# Patient Record
Sex: Male | Born: 1995 | Race: Black or African American | Hispanic: No | Marital: Single | Smoking: Former smoker
Health system: Southern US, Community
[De-identification: ages and names within clinical notes are randomized; demographics above are authoritative.]

## PROBLEM LIST (undated history)

## (undated) DIAGNOSIS — E119 Type 2 diabetes mellitus without complications: Secondary | ICD-10-CM

## (undated) DIAGNOSIS — J45909 Unspecified asthma, uncomplicated: Secondary | ICD-10-CM

---
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
## 2013-03-06 ENCOUNTER — Encounter (HOSPITAL_COMMUNITY): Payer: Self-pay

## 2013-03-06 ENCOUNTER — Emergency Department (HOSPITAL_COMMUNITY)
Admission: EM | Admit: 2013-03-06 | Discharge: 2013-03-06 | Disposition: A | Discharge status: Home / Self Care | Payer: Medicaid Other | Attending: Emergency Medicine | Admitting: Emergency Medicine

## 2013-03-06 DIAGNOSIS — R55 Syncope and collapse: Secondary | ICD-10-CM | POA: Insufficient documentation

## 2013-03-06 DIAGNOSIS — J45909 Unspecified asthma, uncomplicated: Secondary | ICD-10-CM | POA: Insufficient documentation

## 2013-03-06 DIAGNOSIS — K089 Disorder of teeth and supporting structures, unspecified: Secondary | ICD-10-CM | POA: Insufficient documentation

## 2013-03-06 DIAGNOSIS — E119 Type 2 diabetes mellitus without complications: Secondary | ICD-10-CM | POA: Insufficient documentation

## 2013-03-06 DIAGNOSIS — Z79899 Other long term (current) drug therapy: Secondary | ICD-10-CM | POA: Insufficient documentation

## 2013-03-06 DIAGNOSIS — Z87891 Personal history of nicotine dependence: Secondary | ICD-10-CM | POA: Insufficient documentation

## 2013-03-06 DIAGNOSIS — R110 Nausea: Secondary | ICD-10-CM | POA: Insufficient documentation

## 2013-03-06 DIAGNOSIS — K047 Periapical abscess without sinus: Secondary | ICD-10-CM

## 2013-03-06 DIAGNOSIS — R51 Headache: Secondary | ICD-10-CM | POA: Insufficient documentation

## 2013-03-06 DIAGNOSIS — I10 Essential (primary) hypertension: Secondary | ICD-10-CM

## 2013-03-06 HISTORY — DX: Type 2 diabetes mellitus without complications: E11.9

## 2013-03-06 LAB — URINE MICROSCOPIC-ADD ON

## 2013-03-06 LAB — BASIC METABOLIC PANEL
BUN: 13 mg/dL (ref 6–23)
Creatinine, Ser: 1.05 mg/dL (ref 0.50–1.35)
GFR calc Af Amer: >90 mL/min (ref >90)
GFR calc non Af Amer: >90 mL/min (ref >90)

## 2013-03-06 LAB — CBC WITH DIFFERENTIAL/PLATELET
Basophils Relative: 0 % (ref 0–1)
Eosinophils Absolute: 0 10*3/uL (ref 0.0–0.7)
HCT: 39.5 % (ref 39.0–52.0)
Hemoglobin: 13.9 g/dL (ref 13.0–17.0)
MCH: 31.4 pg (ref 26.0–34.0)
MCHC: 35.2 g/dL (ref 30.0–36.0)
MCV: 89.2 fL (ref 78.0–100.0)
Monocytes Absolute: 0.6 10*3/uL (ref 0.1–1.0)
Monocytes Relative: 6 % (ref 3–12)

## 2013-03-06 LAB — URINALYSIS, ROUTINE W REFLEX MICROSCOPIC
Bilirubin Urine: NEGATIVE
Ketones, ur: NEGATIVE mg/dL
Nitrite: NEGATIVE
Urobilinogen, UA: 1 mg/dL (ref 0.0–1.0)
pH: 6 (ref 5.0–8.0)

## 2013-03-06 LAB — CK TOTAL AND CKMB (NOT AT ARMC): Relative Index: 0.8 (ref 0.0–2.5)

## 2013-03-06 MED ORDER — TRAMADOL HCL 50 MG PO TABS: 50.0000 mg | ORAL_TABLET | Freq: Four times a day (QID) | ORAL | Status: DC | PRN

## 2013-03-06 MED ORDER — TRAMADOL HCL 50 MG PO TABS
50.0000 mg | ORAL_TABLET | Freq: Four times a day (QID) | ORAL | Status: DC
  Administered 2013-03-06 (×2): 50 mg via ORAL
  Filled 2013-03-06 (×2): qty 1

## 2013-03-06 MED ORDER — IBUPROFEN 800 MG PO TABS: 800.0000 mg | ORAL_TABLET | Freq: Four times a day (QID) | ORAL | Status: DC | PRN

## 2013-03-06 MED ORDER — IBUPROFEN 800 MG PO TABS
800.0000 mg | ORAL_TABLET | Freq: Once | ORAL | Status: AC
  Administered 2013-03-06: 800 mg via ORAL
  Filled 2013-03-06: qty 1

## 2013-03-06 NOTE — ED Provider Notes (Signed)
History     CSN: [id number]  Arrival date & time 03/06/13  0007   First MD Initiated Contact with Patient 03/06/13 0028      Chief Complaint  Patient presents with  . Fatigue    (Consider location/radiation/quality/duration/timing/severity/associated sxs/prior treatment) HPI 17 yo male presents to the ER from home via EMS with complaint of generalized weakness, headache, tooth pain.  Pt was seen earlier today by his dentist, dx with abscessed tooth and started on clindamycin.  Took 400 mg of advil at around 4 pm. Dinner around 5 pm. Had [employer] from [employer], practiced a little more than usual.  It is his norm to be tired after practice.  Pt reports he felt dizzy, nauseated, hot, and passed out.  Pt reports h/o diabetes, diet controlled.  Asthma-exercise induced.  He reports his mother thinks he has high blood pressure.  No fevers, chills.  Did not strike his head when he passed out.  Past Medical History  Diagnosis Date  . Asthma   . Diabetes mellitus without complication     History reviewed. No pertinent past surgical history.  History reviewed. No pertinent family history.  History  Substance Use Topics  . Smoking status: Former Smoker -- 0.01 packs/day for .2 years    Quit date: 03/17/2012  . Smokeless tobacco: Never Used  . Alcohol Use: No      Review of Systems  All other systems reviewed and are negative.    Allergies  Coconut fatty acids and Mushroom extract complex  Home Medications   Current Outpatient Rx  Name  Route  Sig  Dispense  Refill  . ibuprofen (ADVIL,MOTRIN) 200 MG tablet   Oral   Take 400-800 mg by mouth every 6 (six) hours as needed for pain.         Marland Kitchen PRESCRIPTION MEDICATION   Oral   Take 1 tablet by mouth 4 (four) times daily. Antibiotic for tooth infection  Patient has had one day of this medication.         Marland Kitchen albuterol (PROVENTIL HFA;VENTOLIN HFA) 108 (90 BASE) MCG/ACT inhaler   Inhalation   Inhale 2 puffs into the lungs  every 6 (six) hours as needed. Before exercise           BP 141/96  Pulse 71  Temp(Src) 98.3 F (36.8 C)  Resp 19  SpO2 99%  Physical Exam  Nursing note and vitals reviewed. Constitutional: He is oriented to person, place, and time. He appears well-developed and well-nourished.  Fatigued appearing  HENT:  Head: Normocephalic and atraumatic.  Right Ear: External ear normal.  Left Ear: External ear normal.  Nose: Nose normal.  Mouth/Throat: Oropharynx is clear and moist.  Dental decay noted to second molar on right lower jaw, no fluutance noted.  No LAD  Eyes: Conjunctivae and EOM are normal. Pupils are equal, round, and reactive to light.  Neck: Normal range of motion. Neck supple. No JVD present. No tracheal deviation present. No thyromegaly present.  Cardiovascular: Normal rate, regular rhythm, normal heart sounds and intact distal pulses.  Exam reveals no gallop and no friction rub.   No murmur heard. Pulmonary/Chest: Effort normal and breath sounds normal. No stridor. No respiratory distress. He has no wheezes. He has no rales. He exhibits no tenderness.  Abdominal: Soft. Bowel sounds are normal. He exhibits no distension and no mass. There is no tenderness. There is no rebound and no guarding.  Musculoskeletal: Normal range of motion. He exhibits no edema  and no tenderness.  Lymphadenopathy:    He has no cervical adenopathy.  Neurological: He is alert and oriented to person, place, and time. He has normal reflexes. No cranial nerve deficit. He exhibits normal muscle tone. Coordination normal.  Skin: Skin is warm and dry. No rash noted. No erythema. No pallor.  Psychiatric: He has a normal mood and affect. His behavior is normal. Judgment and thought content normal.    ED Course  Procedures (including critical care time)  Labs Reviewed  CBC WITH DIFFERENTIAL - Abnormal; Notable for the following:    WBC 10.9 (*)    RDW 11.3 (*)    Neutro Abs 8.4 (*)    All other  components within normal limits  URINALYSIS, ROUTINE W REFLEX MICROSCOPIC - Abnormal; Notable for the following:    Leukocytes, UA SMALL (*)    All other components within normal limits  CK TOTAL AND CKMB - Abnormal; Notable for the following:    Total CK 307 (*)    All other components within normal limits  URINE CULTURE  BASIC METABOLIC PANEL  URINE MICROSCOPIC-ADD ON   No results found.   Date: 03/06/2013  Rate: 71  Rhythm: normal sinus rhythm  QRS Axis: normal  Intervals: normal  ST/T Wave abnormalities: early repolarization  Conduction Disutrbances:none  Narrative Interpretation:   Old EKG Reviewed: none available    1. Syncope   2. Hypertension   3. Dental abscess       MDM  17 yo male with weakness, syncope.  Will check labs, orthostatics.  EKG with early repol.    Workup unremarkable aside from HTN.  Pt feeling better after pain medications.  To f/u with pcm for htn evaluation.        Olivia Mackie, MD 03/06/13 [phone]

## 2013-03-06 NOTE — ED Notes (Signed)
Per EMS: Pt from home with c/o generalized weakness since 2300. Pt seen today by dentist with abscess and sent home with rx Clindamycin, tylenol. Hx: diabetic. BG 86. EKG shows spiked T waves. PT AO x 4. 74 SR.

## 2013-03-07 LAB — URINE CULTURE: Colony Count: NO GROWTH

## 2013-08-23 ENCOUNTER — Emergency Department (HOSPITAL_COMMUNITY)
Admission: EM | Admit: 2013-08-23 | Discharge: 2013-08-24 | Disposition: A | Discharge status: Home / Self Care | Payer: No Typology Code available for payment source | Attending: Emergency Medicine | Admitting: Emergency Medicine

## 2013-08-23 ENCOUNTER — Encounter (HOSPITAL_COMMUNITY): Payer: Self-pay | Admitting: *Deleted

## 2013-08-23 DIAGNOSIS — E119 Type 2 diabetes mellitus without complications: Secondary | ICD-10-CM | POA: Insufficient documentation

## 2013-08-23 DIAGNOSIS — J45909 Unspecified asthma, uncomplicated: Secondary | ICD-10-CM | POA: Insufficient documentation

## 2013-08-23 DIAGNOSIS — Z87891 Personal history of nicotine dependence: Secondary | ICD-10-CM | POA: Insufficient documentation

## 2013-08-23 DIAGNOSIS — R509 Fever, unspecified: Secondary | ICD-10-CM | POA: Insufficient documentation

## 2013-08-23 DIAGNOSIS — J029 Acute pharyngitis, unspecified: Secondary | ICD-10-CM | POA: Insufficient documentation

## 2013-08-23 MED ORDER — ACETAMINOPHEN 325 MG PO TABS
650.0000 mg | ORAL_TABLET | Freq: Once | ORAL | Status: AC
  Administered 2013-08-23: 650 mg via ORAL
  Filled 2013-08-23: qty 2

## 2013-08-23 NOTE — ED Provider Notes (Signed)
CSN: [id number]     Arrival date & time 08/23/13  2137 History   First MD Initiated Contact with Patient 08/23/13 2331     Chief Complaint  Patient presents with  . Sore Throat   (Consider location/radiation/quality/duration/timing/severity/associated sxs/prior Treatment) HPI Comments: Patient states she's had a sore throat for the last 24 hours.  He tried over-the-counter throat lozenges with minimal relief.  He denies any fever, although he has a fever.  In the emergency Department 102.3 is a non-insulin-dependent diabetic, with normal blood sugars  Patient is a 17 y.o. male presenting with pharyngitis. The history is provided by the patient.  Sore Throat This is a new problem. The current episode started yesterday. The problem occurs constantly. The problem has been unchanged. Pertinent negatives include no fever or myalgias. The symptoms are aggravated by swallowing. Treatments tried: Throat lozenges. The treatment provided mild relief.    Past Medical History  Diagnosis Date  . Asthma   . Diabetes mellitus without complication    History reviewed. No pertinent past surgical history. No family history on file. History  Substance Use Topics  . Smoking status: Former Smoker -- 0.01 packs/day for .2 years    Quit date: 03/17/2012  . Smokeless tobacco: Never Used  . Alcohol Use: No    Review of Systems  Constitutional: Negative for fever.  HENT: Negative for rhinorrhea.   Musculoskeletal: Negative for myalgias.  All other systems reviewed and are negative.    Allergies  Coconut fatty acids and Mushroom extract complex  Home Medications   Current Outpatient Rx  Name  Route  Sig  Dispense  Refill  . Throat Lozenges (COUGH DROPS MENTHOL MT)   Mouth/Throat   Use as directed 1 lozenge in the mouth or throat daily as needed (for sore throat).          BP 132/74  Pulse 92  Temp(Src) 102.3 F (39.1 C) (Oral)  Resp 18  SpO2 98% Physical Exam  Nursing note and vitals  reviewed. Constitutional: He appears well-developed and well-nourished.  HENT:  Head: Normocephalic.  Mouth/Throat: Oropharynx is clear and moist.  Eyes: Pupils are equal, round, and reactive to light.  Cardiovascular: Normal rate and regular rhythm.   Pulmonary/Chest: Effort normal and breath sounds normal.  Lymphadenopathy:    He has no cervical adenopathy.  Neurological: He is alert.    ED Course  Procedures (including critical care time) Labs Review Labs Reviewed  RAPID STREP SCREEN  CULTURE, GROUP A STREP   Imaging Review No results found.  MDM  No diagnosis found.  Strep test is negative.  Throat.  Does not look infected.  Patient is instructed to take alternating doses of Tylenol, and ibuprofen, drink plenty of fluids and get enough rest    Arman Filter, NP 08/23/13 2341

## 2013-08-23 NOTE — ED Notes (Signed)
The pt has had a sore throat since last pm with bi-lateral earaches

## 2013-08-24 MED ORDER — IBUPROFEN 200 MG PO TABS
600.0000 mg | ORAL_TABLET | Freq: Once | ORAL | Status: AC
  Administered 2013-08-24: 600 mg via ORAL
  Filled 2013-08-24 (×2): qty 1

## 2013-08-24 NOTE — ED Provider Notes (Signed)
Medical screening examination/treatment/procedure(s) were performed by non-physician practitioner and as supervising physician I was immediately available for consultation/collaboration.   Brandt Loosen, MD 08/24/13 2257

## 2013-08-24 NOTE — ED Notes (Signed)
Pt denies any questions upon discharge, pt medicated for fever prior to discharge.

## 2013-08-25 ENCOUNTER — Emergency Department (HOSPITAL_COMMUNITY)
Admission: EM | Admit: 2013-08-25 | Discharge: 2013-08-26 | Disposition: A | Discharge status: Home / Self Care | Payer: No Typology Code available for payment source | Attending: Emergency Medicine | Admitting: Emergency Medicine

## 2013-08-25 ENCOUNTER — Encounter (HOSPITAL_COMMUNITY): Payer: Self-pay | Admitting: Emergency Medicine

## 2013-08-25 DIAGNOSIS — R509 Fever, unspecified: Secondary | ICD-10-CM | POA: Insufficient documentation

## 2013-08-25 DIAGNOSIS — R1013 Epigastric pain: Secondary | ICD-10-CM | POA: Insufficient documentation

## 2013-08-25 DIAGNOSIS — R1012 Left upper quadrant pain: Secondary | ICD-10-CM | POA: Insufficient documentation

## 2013-08-25 DIAGNOSIS — Z87891 Personal history of nicotine dependence: Secondary | ICD-10-CM | POA: Insufficient documentation

## 2013-08-25 DIAGNOSIS — K5900 Constipation, unspecified: Secondary | ICD-10-CM | POA: Insufficient documentation

## 2013-08-25 DIAGNOSIS — J029 Acute pharyngitis, unspecified: Secondary | ICD-10-CM | POA: Insufficient documentation

## 2013-08-25 DIAGNOSIS — J45909 Unspecified asthma, uncomplicated: Secondary | ICD-10-CM | POA: Insufficient documentation

## 2013-08-25 DIAGNOSIS — IMO0001 Reserved for inherently not codable concepts without codable children: Secondary | ICD-10-CM | POA: Insufficient documentation

## 2013-08-25 DIAGNOSIS — R000 Tachycardia, unspecified: Secondary | ICD-10-CM | POA: Insufficient documentation

## 2013-08-25 DIAGNOSIS — E119 Type 2 diabetes mellitus without complications: Secondary | ICD-10-CM | POA: Insufficient documentation

## 2013-08-25 DIAGNOSIS — R1011 Right upper quadrant pain: Secondary | ICD-10-CM | POA: Insufficient documentation

## 2013-08-25 LAB — MONONUCLEOSIS SCREEN: Mono Screen: NEGATIVE

## 2013-08-25 MED ORDER — SODIUM CHLORIDE 0.9 % IV BOLUS (SEPSIS)
1000.0000 mL | Freq: Once | INTRAVENOUS | Status: AC
  Administered 2013-08-25: 1000 mL via INTRAVENOUS

## 2013-08-25 MED ORDER — KETOROLAC TROMETHAMINE 30 MG/ML IJ SOLN
30.0000 mg | Freq: Once | INTRAMUSCULAR | Status: AC
  Administered 2013-08-25: 30 mg via INTRAVENOUS
  Filled 2013-08-25: qty 1

## 2013-08-25 NOTE — ED Provider Notes (Signed)
CSN: [id number]     Arrival date & time 08/25/13  2247 History   First MD Initiated Contact with Patient 08/25/13 2257     Chief Complaint  Patient presents with  . Sore Throat   (Consider location/radiation/quality/duration/timing/severity/associated sxs/prior Treatment) HPI Comments: She was seen 2 days ago, with the same complaints of pharyngitis, and low-grade fever.  His strep was negative at that time to check the culture is negative, as well.  He comes in reporting, that he's been taking Tylenol 500 mg every 4 hours and 400 mg of ibuprofen every 4 hours.  He continues to have sore throat, generalized myalgias, and now is complaining of abdominal pain, crampy in nature, and shaking.  He, says his appetite has been fine, and he is drinking lots of fluids, but his throat pain is still significant  Patient is a 17 y.o. male presenting with pharyngitis. The history is provided by the patient.  Sore Throat This is a recurrent problem. The current episode started in the past 7 days. The problem occurs constantly. The problem has been unchanged. Associated symptoms include chills, a fever, myalgias and a sore throat. Pertinent negatives include no abdominal pain, anorexia, coughing, nausea, neck pain, rash, swollen glands, urinary symptoms or vomiting. The symptoms are aggravated by swallowing. He has tried acetaminophen, NSAIDs and drinking for the symptoms. The treatment provided no relief.    Past Medical History  Diagnosis Date  . Asthma   . Diabetes mellitus without complication    History reviewed. No pertinent past surgical history. No family history on file. History  Substance Use Topics  . Smoking status: Former Smoker -- 0.01 packs/day for .2 years    Quit date: 03/17/2012  . Smokeless tobacco: Never Used  . Alcohol Use: No    Review of Systems  Constitutional: Positive for fever and chills.  HENT: Positive for sore throat. Negative for trouble swallowing and neck pain.    Respiratory: Negative for cough and shortness of breath.   Gastrointestinal: Positive for constipation. Negative for nausea, vomiting, abdominal pain and anorexia.  Genitourinary: Negative for dysuria.  Musculoskeletal: Positive for myalgias.  Skin: Negative for rash.  All other systems reviewed and are negative.    Allergies  Coconut fatty acids and Mushroom extract complex  Home Medications   Current Outpatient Rx  Name  Route  Sig  Dispense  Refill  . Throat Lozenges (COUGH DROPS MENTHOL MT)   Mouth/Throat   Use as directed 1 lozenge in the mouth or throat daily as needed (for sore throat).          BP 125/68  Pulse 120  Temp(Src) 100.9 F (38.3 C) (Oral)  Resp 22  SpO2 98% Physical Exam  Nursing note and vitals reviewed. Constitutional: He is oriented to person, place, and time. He appears well-developed and well-nourished.  HENT:  Head: Normocephalic and atraumatic.  Right Ear: External ear normal.  Left Ear: External ear normal.  Mouth/Throat: Mucous membranes are dry. No edematous. No oropharyngeal exudate or tonsillar abscesses.  Cardiovascular: Regular rhythm.  Tachycardia present.   Pulmonary/Chest: Effort normal and breath sounds normal. He has no wheezes.  Abdominal: Soft. He exhibits no distension. There is no hepatosplenomegaly, splenomegaly or hepatomegaly. There is tenderness in the right upper quadrant, epigastric area and left upper quadrant. There is no rigidity, no rebound and no guarding.  Lymphadenopathy:    He has no cervical adenopathy.  Neurological: He is alert and oriented to person, place, and time.  Skin:  Skin is warm and dry. No rash noted. No pallor.    ED Course  Procedures (including critical care time) Labs Review Labs Reviewed  MONONUCLEOSIS SCREEN  ACETAMINOPHEN LEVEL   Imaging Review No results found.  MDM  No diagnosis found. To 2 L of fluid, alternating doses of IV, Toradol, and Tylenol.  Patient is feeling, better,  heart rate is normalized.  Will be discharged home with continued azithromycin by mouth    Arman Filter, NP 08/26/13 0320

## 2013-08-25 NOTE — ED Notes (Signed)
Pt. reports persistent sore throat for several days seen here 2 days ago received ibuprofen discharged home with no improvement , respirations unlabored / airway intact.

## 2013-08-26 LAB — CULTURE, GROUP A STREP

## 2013-08-26 MED ORDER — ACETAMINOPHEN 325 MG PO TABS
650.0000 mg | ORAL_TABLET | Freq: Once | ORAL | Status: AC
  Administered 2013-08-26: 650 mg via ORAL
  Filled 2013-08-26: qty 2

## 2013-08-26 MED ORDER — SODIUM CHLORIDE 0.9 % IV BOLUS (SEPSIS)
1000.0000 mL | Freq: Once | INTRAVENOUS | Status: AC
  Administered 2013-08-26: 1000 mL via INTRAVENOUS

## 2013-08-26 MED ORDER — AZITHROMYCIN 250 MG PO TABS
500.0000 mg | ORAL_TABLET | Freq: Once | ORAL | Status: AC
  Administered 2013-08-26: 500 mg via ORAL
  Filled 2013-08-26: qty 2

## 2013-08-26 MED ORDER — ACETAMINOPHEN 325 MG PO TABS: 650.0000 mg | ORAL_TABLET | Freq: Once | ORAL | Status: DC

## 2013-08-26 MED ORDER — AZITHROMYCIN 250 MG PO TABS: 250.0000 mg | ORAL_TABLET | Freq: Every day | ORAL | Status: DC

## 2013-08-26 NOTE — ED Provider Notes (Signed)
Medical screening examination/treatment/procedure(s) were performed by non-physician practitioner and as supervising physician I was immediately available for consultation/collaboration.  Olivia Mackie, MD 08/26/13 [phone]

## 2013-12-30 ENCOUNTER — Encounter (HOSPITAL_COMMUNITY): Payer: Self-pay | Admitting: Emergency Medicine

## 2013-12-30 ENCOUNTER — Emergency Department (HOSPITAL_COMMUNITY)
Admission: EM | Admit: 2013-12-30 | Discharge: 2013-12-30 | Disposition: A | Discharge status: Home / Self Care | Payer: No Typology Code available for payment source | Attending: Emergency Medicine | Admitting: Emergency Medicine

## 2013-12-30 DIAGNOSIS — Z87891 Personal history of nicotine dependence: Secondary | ICD-10-CM | POA: Insufficient documentation

## 2013-12-30 DIAGNOSIS — M79609 Pain in unspecified limb: Secondary | ICD-10-CM | POA: Insufficient documentation

## 2013-12-30 DIAGNOSIS — K2900 Acute gastritis without bleeding: Secondary | ICD-10-CM | POA: Insufficient documentation

## 2013-12-30 DIAGNOSIS — E119 Type 2 diabetes mellitus without complications: Secondary | ICD-10-CM | POA: Insufficient documentation

## 2013-12-30 DIAGNOSIS — R110 Nausea: Secondary | ICD-10-CM | POA: Insufficient documentation

## 2013-12-30 DIAGNOSIS — J45909 Unspecified asthma, uncomplicated: Secondary | ICD-10-CM | POA: Insufficient documentation

## 2013-12-30 DIAGNOSIS — M549 Dorsalgia, unspecified: Secondary | ICD-10-CM | POA: Insufficient documentation

## 2013-12-30 DIAGNOSIS — K2970 Gastritis, unspecified, without bleeding: Secondary | ICD-10-CM

## 2013-12-30 LAB — CBC WITH DIFFERENTIAL/PLATELET
BASOS ABS: 0 10*3/uL (ref 0.0–0.1)
Basophils Relative: 0 % (ref 0–1)
EOS PCT: 1 % (ref 0–5)
Eosinophils Absolute: 0.1 10*3/uL (ref 0.0–0.7)
HEMATOCRIT: 41.6 % (ref 39.0–52.0)
Hemoglobin: 14.5 g/dL (ref 13.0–17.0)
LYMPHS ABS: 1.6 10*3/uL (ref 0.7–4.0)
LYMPHS PCT: 31 % (ref 12–46)
MCH: 30.8 pg (ref 26.0–34.0)
MCHC: 34.9 g/dL (ref 30.0–36.0)
MCV: 88.3 fL (ref 78.0–100.0)
Monocytes Absolute: 0.4 10*3/uL (ref 0.1–1.0)
Monocytes Relative: 8 % (ref 3–12)
NEUTROS ABS: 3.1 10*3/uL (ref 1.7–7.7)
Neutrophils Relative %: 60 % (ref 43–77)
PLATELETS: 220 10*3/uL (ref 150–400)
RBC: 4.71 MIL/uL (ref 4.22–5.81)
RDW: 11.3 % — AB (ref 11.5–15.5)
WBC: 5.1 10*3/uL (ref 4.0–10.5)

## 2013-12-30 LAB — COMPREHENSIVE METABOLIC PANEL
ALK PHOS: 55 U/L (ref 39–117)
ALT: 10 U/L (ref 0–53)
AST: 21 U/L (ref 0–37)
Albumin: 4.4 g/dL (ref 3.5–5.2)
BILIRUBIN TOTAL: 2 mg/dL — AB (ref 0.3–1.2)
BUN: 11 mg/dL (ref 6–23)
CHLORIDE: 103 meq/L (ref 96–112)
CO2: 29 meq/L (ref 19–32)
Calcium: 9.3 mg/dL (ref 8.4–10.5)
Creatinine, Ser: 0.97 mg/dL (ref 0.50–1.35)
GLUCOSE: 91 mg/dL (ref 70–99)
Potassium: 4.4 mEq/L (ref 3.7–5.3)
SODIUM: 141 meq/L (ref 137–147)
Total Protein: 7.7 g/dL (ref 6.0–8.3)

## 2013-12-30 LAB — LIPASE, BLOOD: Lipase: 40 U/L (ref 11–59)

## 2013-12-30 MED ORDER — ACETAMINOPHEN 325 MG PO TABS
650.0000 mg | ORAL_TABLET | Freq: Once | ORAL | Status: AC
  Administered 2013-12-30: 650 mg via ORAL
  Filled 2013-12-30: qty 2

## 2013-12-30 MED ORDER — ALUM & MAG HYDROXIDE-SIMETH 200-200-20 MG/5ML PO SUSP
30.0000 mL | Freq: Once | ORAL | Status: AC
  Administered 2013-12-30: 30 mL via ORAL
  Filled 2013-12-30: qty 30

## 2013-12-30 NOTE — Discharge Instructions (Signed)
°  Avoid using ibuprofen. Use Tylenol, for pain. Use Maalox before meals and at bedtime to help with abdominal discomfort.    Gastritis, Adult Gastritis is soreness and puffiness (inflammation) of the lining of the stomach. If you do not get help, gastritis can cause bleeding and sores (ulcers) in the stomach. HOME CARE   Only take medicine as told by your doctor.  If you were given antibiotic medicines, take them as told. Finish the medicines even if you start to feel better.  Drink enough fluids to keep your pee (urine) clear or pale yellow.  Avoid foods and drinks that make your problems worse. Foods you may want to avoid include:  Caffeine or alcohol.  Chocolate.  Mint.  Garlic and onions.  Spicy foods.  Citrus fruits, including oranges, lemons, or limes.  Food containing tomatoes, including sauce, chili, salsa, and pizza.  Fried and fatty foods.  Eat small meals throughout the day instead of large meals. GET HELP RIGHT AWAY IF:   You have black or dark red poop (stools).  You throw up (vomit) blood. It may look like coffee grounds.  You cannot keep fluids down.  Your belly (abdominal) pain gets worse.  You have a fever.  You do not feel better after 1 week.  You have any other questions or concerns. MAKE SURE YOU:   Understand these instructions.  Will watch your condition.  Will get help right away if you are not doing well or get worse. Document Released: 05/21/2008 Document Revised: 02/25/2012 Document Reviewed: 01/16/2012 [facility] Patient Information 2014 [place], [place].

## 2013-12-30 NOTE — ED Notes (Signed)
Pt presents to department for evaluation of abdominal pain and nausea. Onset today after taking aleve. 6/10 pain at the time. Pt is alert and oriented x4. No acute signs of distress noted.

## 2013-12-30 NOTE — ED Provider Notes (Signed)
CSN: [id number]     Arrival date & time 12/30/13  1603 History   First MD Initiated Contact with Patient 12/30/13 1931     Chief Complaint  Patient presents with  . Abdominal Pain  . Nausea   (Consider location/radiation/quality/duration/timing/severity/associated sxs/prior Treatment) Patient is a 18 y.o. male presenting with abdominal pain. The history is provided by the patient.  Abdominal Pain Cody Navarro is a 18 y.o. male who presents for evaluation of upper abdominal pain, that started after he took Advil, for arm pain and back pain. He has not had fever, chills, cough, shortness of breath, chest pain, weakness, or dizziness. He attributes the pain to recently starting back on the "drum-line". He had been off, on vacation, for 3 weeks. He has previously taken Advil without problems. There are no other known modifying factors.   Past Medical History  Diagnosis Date  . Asthma   . Diabetes mellitus without complication    History reviewed. No pertinent past surgical history. History reviewed. No pertinent family history. History  Substance Use Topics  . Smoking status: Former Smoker -- 0.01 packs/day for .2 years    Quit date: 03/17/2012  . Smokeless tobacco: Never Used  . Alcohol Use: No    Review of Systems  Gastrointestinal: Positive for abdominal pain.  All other systems reviewed and are negative.    Allergies  Banana; Coconut fatty acids; and Mushroom extract complex  Home Medications   Current Outpatient Rx  Name  Route  Sig  Dispense  Refill  . ibuprofen (ADVIL,MOTRIN) 200 MG tablet   Oral   Take 400 mg by mouth every 6 (six) hours as needed for moderate pain.          BP 128/88  Pulse 68  Temp(Src) 97.6 F (36.4 C) (Oral)  Resp 18  Ht 5\' 10"  (1.778 m)  Wt 156 lb (70.761 kg)  BMI 22.38 kg/m2  SpO2 99% Physical Exam  Nursing note and vitals reviewed. Constitutional: He is oriented to person, place, and time. He appears well-developed and  well-nourished.  HENT:  Head: Normocephalic and atraumatic.  Right Ear: External ear normal.  Left Ear: External ear normal.  Eyes: Conjunctivae and EOM are normal. Pupils are equal, round, and reactive to light.  Neck: Normal range of motion and phonation normal. Neck supple.  Cardiovascular: Normal rate, regular rhythm, normal heart sounds and intact distal pulses.   Pulmonary/Chest: Effort normal and breath sounds normal. He exhibits no bony tenderness.  Abdominal: Soft. Normal appearance. There is tenderness (epigastric, and suprapubic, mild).  Musculoskeletal: Normal range of motion.  Neurological: He is alert and oriented to person, place, and time. No cranial nerve deficit or sensory deficit. He exhibits normal muscle tone. Coordination normal.  Skin: Skin is warm, dry and intact.  Psychiatric: He has a normal mood and affect. His behavior is normal. Judgment and thought content normal.    ED Course  Procedures (including critical care time)  Medications  alum & mag hydroxide-simeth (MAALOX/MYLANTA) 200-200-20 MG/5ML suspension 30 mL (30 mLs Oral Given 12/30/13 1946)  acetaminophen (TYLENOL) tablet 650 mg (650 mg Oral Given 12/30/13 1946)    Patient Vitals for the past 24 hrs:  BP Temp Temp src Pulse Resp SpO2 Height Weight  12/30/13 2250 128/88 mmHg 97.6 F (36.4 C) Oral 68 18 99 % - -  12/30/13 2100 126/85 mmHg - - 78 - 100 % - -  12/30/13 2030 - - - 65 - 100 % - -  12/30/13 1945 - - - 66 - 100 % - -  12/30/13 1930 - - - 67 - 100 % - -  12/30/13 1923 117/82 mmHg 98 F (36.7 C) Oral 62 16 100 % - -  12/30/13 1619 119/80 mmHg 97.4 F (36.3 C) Oral 71 18 99 % 5\' 10"  (1.778 m) 156 lb (70.761 kg)    10:15 PM Reevaluation with update and discussion. After initial assessment and treatment, an updated evaluation reveals is comfortable, now, and denies abdominal pain. Cody Navarro      Labs Review Labs Reviewed  CBC WITH DIFFERENTIAL - Abnormal; Notable for the  following:    RDW 11.3 (*)    All other components within normal limits  COMPREHENSIVE METABOLIC PANEL - Abnormal; Notable for the following:    Total Bilirubin 2.0 (*)    All other components within normal limits  LIPASE, BLOOD   Imaging Review No results found.  EKG Interpretation   None       MDM   1. Gastritis      Nonspecific abdominal pain, resolved with antacid. I suspect mild gastritis related to use of ibuprofen  Nursing Notes Reviewed/ Care Coordinated Applicable Imaging Reviewed Interpretation of Laboratory Data incorporated into ED treatment  The patient appears reasonably screened and/or stabilized for discharge and I doubt any other medical condition or other [facility] requiring further screening, evaluation, or treatment in the ED at this time prior to discharge.  Plan: Home Medications- Maalox prn; Home Treatments- rest; return here if the recommended treatment, does not improve the symptoms; Recommended follow up- PCP of choice prn    Cody MelterElliott Navarro Ramsie Ostrander, MD 12/31/13 0021

## 2014-03-29 ENCOUNTER — Emergency Department (HOSPITAL_COMMUNITY): Payer: Medicaid Other

## 2014-03-29 ENCOUNTER — Emergency Department (HOSPITAL_COMMUNITY)
Admission: EM | Admit: 2014-03-29 | Discharge: 2014-03-29 | Disposition: A | Discharge status: Home / Self Care | Payer: Medicaid Other | Attending: Emergency Medicine | Admitting: Emergency Medicine

## 2014-03-29 ENCOUNTER — Encounter (HOSPITAL_COMMUNITY): Payer: Self-pay | Admitting: Emergency Medicine

## 2014-03-29 DIAGNOSIS — J45901 Unspecified asthma with (acute) exacerbation: Secondary | ICD-10-CM | POA: Insufficient documentation

## 2014-03-29 DIAGNOSIS — R071 Chest pain on breathing: Secondary | ICD-10-CM | POA: Insufficient documentation

## 2014-03-29 DIAGNOSIS — E119 Type 2 diabetes mellitus without complications: Secondary | ICD-10-CM | POA: Insufficient documentation

## 2014-03-29 DIAGNOSIS — Z87891 Personal history of nicotine dependence: Secondary | ICD-10-CM | POA: Insufficient documentation

## 2014-03-29 DIAGNOSIS — R0789 Other chest pain: Secondary | ICD-10-CM

## 2014-03-29 MED ORDER — IBUPROFEN 800 MG PO TABS
800.0000 mg | ORAL_TABLET | Freq: Once | ORAL | Status: AC
Start: 2014-03-29 — End: 2014-03-29
  Administered 2014-03-29: 800 mg via ORAL
  Filled 2014-03-29: qty 1

## 2014-03-29 MED ORDER — IBUPROFEN 600 MG PO TABS: 600.0000 mg | ORAL_TABLET | Freq: Four times a day (QID) | ORAL | Status: DC | PRN

## 2014-03-29 NOTE — ED Notes (Signed)
Pt states that he has been coughing x 1 week and has had chest wall pain that hurts worse when he breathes x 3 days. Alert and oriented.

## 2014-03-29 NOTE — ED Provider Notes (Signed)
CSN: [id number]     Arrival date & time 03/29/14  0019 History   First MD Initiated Contact with Patient 03/29/14 0058     Chief Complaint  Patient presents with  . Chest Wall Pain   . Cough     (Consider location/radiation/quality/duration/timing/severity/associated sxs/prior Treatment) HPI Comments: Patient is a 18 year old male with a history of asthma and DM who presents to the emergency department for right-sided chest pain x4 days. Patient states the pain is aching in nature and nonradiating. Pain is worse with deep breathing and coughing. Patient endorses an associated dry nonproductive cough. He has not taken anything for symptoms. He denies associated fever, syncope, hemoptysis, abdominal pain or vomiting, URI symptoms, a hx of coagulopathies, and recent surgeries or hospitalizations.  Patient is a 18 y.o. male presenting with cough. The history is provided by the patient. No language interpreter was used.  Cough Associated symptoms: chest pain and shortness of breath     Past Medical History  Diagnosis Date  . Asthma   . Diabetes mellitus without complication    History reviewed. No pertinent past surgical history. History reviewed. No pertinent family history. History  Substance Use Topics  . Smoking status: Former Smoker -- 0.01 packs/day for .2 years    Quit date: 03/17/2012  . Smokeless tobacco: Never Used  . Alcohol Use: No    Review of Systems  Respiratory: Positive for cough, chest tightness and shortness of breath.   Cardiovascular: Positive for chest pain.  All other systems reviewed and are negative.     Allergies  Banana; Coconut fatty acids; and Mushroom extract complex  Home Medications   Current Outpatient Rx  Name  Route  Sig  Dispense  Refill  . ibuprofen (ADVIL,MOTRIN) 600 MG tablet   Oral   Take 1 tablet (600 mg total) by mouth every 6 (six) hours as needed.   30 tablet   0    BP 112/70  Pulse 89  Temp(Src) 98.5 F (36.9 C) (Oral)   SpO2 98%  Physical Exam  Nursing note and vitals reviewed. Constitutional: He is oriented to person, place, and time. He appears well-developed and well-nourished. No distress.  HENT:  Head: Normocephalic and atraumatic.  Eyes: Conjunctivae and EOM are normal. No scleral icterus.  Neck: Normal range of motion.  Cardiovascular: Normal rate, regular rhythm, normal heart sounds and intact distal pulses.   Pulmonary/Chest: Effort normal and breath sounds normal. No respiratory distress. He has no wheezes. He has no rales.  No tachypnea, dyspnea, retractions, or accessory muscle use. Chest expansion symmetric. No chest tenderness to palpation.  Abdominal: Soft. He exhibits no distension. There is no tenderness.  Musculoskeletal: Normal range of motion.  Neurological: He is alert and oriented to person, place, and time.   GCS 15. Patient moves extremities without ataxia.  Skin: Skin is warm and dry. No rash noted. He is not diaphoretic. No erythema. No pallor.  Psychiatric: He has a normal mood and affect. His behavior is normal.    ED Course  Procedures (including critical care time) Labs Review Labs Reviewed - No data to display  Imaging Review Dg Chest 2 View  03/29/2014   CLINICAL DATA:  Upper mid chest pain  EXAM: CHEST  2 VIEW  COMPARISON:  None.  FINDINGS: The heart size and mediastinal contours are within normal limits. Both lungs are clear. Dextroscoliosis of the thoracolumbar spine.  IMPRESSION: No active cardiopulmonary disease.   Electronically Signed   By: Elige KoHetal  Patel  On: 03/29/2014 02:40     EKG Interpretation None      MDM   Final diagnoses:  Costochondral chest pain    Uncomplicated costochondral chest pain. Patient endorses frequent lifting at work and states he has been coughing times one week; symptoms likely secondary to one or both of these. Patient without tachypnea, dyspnea, or hypoxia today. He is afebrile and hemodynamically stable. Chest x-ray shows  no evidence of focal consolidation, pneumonia, or pneumothorax. No rib fracture is identified. Doubt pulmonary embolism; patient PERC negative. He is stable for discharge with prescription for ibuprofen for pain control. Return precautions provided and patient agreeable to plan with no unaddressed concerns.  Filed Vitals:   03/29/14 0025  BP: 112/70  Pulse: 89  Temp: 98.5 F (36.9 C)  TempSrc: Oral  SpO2: 98%       Antony MaduraKelly Maree Ainley, PA-C 03/29/14 [phone]

## 2014-03-29 NOTE — Discharge Instructions (Signed)

## 2014-04-04 NOTE — ED Provider Notes (Signed)
Medical screening examination/treatment/procedure(s) were performed by non-physician practitioner and as supervising physician I was immediately available for consultation/collaboration.   EKG Interpretation None        Steve Gregg M Mustafa Potts, MD 04/04/14 1408 

## 2014-05-22 ENCOUNTER — Emergency Department (HOSPITAL_COMMUNITY)
Admission: EM | Admit: 2014-05-22 | Discharge: 2014-05-22 | Disposition: A | Discharge status: Home / Self Care | Payer: Medicaid Other | Attending: Emergency Medicine | Admitting: Emergency Medicine

## 2014-05-22 ENCOUNTER — Encounter (HOSPITAL_COMMUNITY): Payer: Self-pay | Admitting: Emergency Medicine

## 2014-05-22 ENCOUNTER — Emergency Department (HOSPITAL_COMMUNITY): Payer: Medicaid Other

## 2014-05-22 DIAGNOSIS — E119 Type 2 diabetes mellitus without complications: Secondary | ICD-10-CM | POA: Insufficient documentation

## 2014-05-22 DIAGNOSIS — J45909 Unspecified asthma, uncomplicated: Secondary | ICD-10-CM | POA: Insufficient documentation

## 2014-05-22 DIAGNOSIS — Y939 Activity, unspecified: Secondary | ICD-10-CM | POA: Insufficient documentation

## 2014-05-22 DIAGNOSIS — IMO0002 Reserved for concepts with insufficient information to code with codable children: Secondary | ICD-10-CM | POA: Insufficient documentation

## 2014-05-22 DIAGNOSIS — Y929 Unspecified place or not applicable: Secondary | ICD-10-CM | POA: Insufficient documentation

## 2014-05-22 DIAGNOSIS — S60222A Contusion of left hand, initial encounter: Secondary | ICD-10-CM

## 2014-05-22 DIAGNOSIS — S60511A Abrasion of right hand, initial encounter: Secondary | ICD-10-CM

## 2014-05-22 DIAGNOSIS — Z87891 Personal history of nicotine dependence: Secondary | ICD-10-CM | POA: Insufficient documentation

## 2014-05-22 DIAGNOSIS — Z91018 Allergy to other foods: Secondary | ICD-10-CM | POA: Insufficient documentation

## 2014-05-22 DIAGNOSIS — S60512A Abrasion of left hand, initial encounter: Secondary | ICD-10-CM

## 2014-05-22 DIAGNOSIS — S60221A Contusion of right hand, initial encounter: Secondary | ICD-10-CM

## 2014-05-22 DIAGNOSIS — S60229A Contusion of unspecified hand, initial encounter: Secondary | ICD-10-CM | POA: Insufficient documentation

## 2014-05-22 MED ORDER — OXYCODONE-ACETAMINOPHEN 5-325 MG PO TABS
1.0000 | ORAL_TABLET | Freq: Once | ORAL | Status: AC
  Administered 2014-05-22: 1 via ORAL
  Filled 2014-05-22: qty 1

## 2014-05-22 MED ORDER — BACITRACIN ZINC 500 UNIT/GM EX OINT: 1.0000 "application " | TOPICAL_OINTMENT | Freq: Two times a day (BID) | CUTANEOUS | Status: DC

## 2014-05-22 MED ORDER — IBUPROFEN 600 MG PO TABS: 600.0000 mg | ORAL_TABLET | Freq: Four times a day (QID) | ORAL | Status: DC | PRN

## 2014-05-22 MED ORDER — IBUPROFEN 200 MG PO TABS
400.0000 mg | ORAL_TABLET | Freq: Once | ORAL | Status: AC
  Administered 2014-05-22: 400 mg via ORAL
  Filled 2014-05-22: qty 2

## 2014-05-22 NOTE — ED Provider Notes (Signed)
CSN: [id number]     Arrival date & time 05/22/14  0416 History   None    Chief Complaint  Patient presents with  . Hand Pain  . Hand Injury     (Consider location/radiation/quality/duration/timing/severity/associated sxs/prior Treatment) HPI This patient is a generally healthy young man who is right-hand dominant. He comes in with bilateral hand pain after having punched a wall. His pain is worse on the right. The pain is aching and diffuse. It is worse with movement of the fingers and hand. Nonradiating. Left hand pain is mild, worse with movement of the fingers. Patient denies trauma or pain in any other region.  His tetanus is up to date.  Past Medical History  Diagnosis Date  . Asthma   . Diabetes mellitus without complication    History reviewed. No pertinent past surgical history. No family history on file. History  Substance Use Topics  . Smoking status: Former Smoker -- 0.01 packs/day for .2 years    Quit date: 03/17/2012  . Smokeless tobacco: Never Used  . Alcohol Use: No    Review of Systems Ten point review of symptoms performed and is negative with the exception of symptoms noted above.     Allergies  Banana; Coconut fatty acids; and Mushroom extract complex  Home Medications   Prior to Admission medications   Not on File   BP 128/93  Pulse 88  Temp(Src) 97.9 F (36.6 C) (Oral)  Resp 30  Ht 5\' 10"  (1.778 m)  Wt 182 lb (82.555 kg)  BMI 26.11 kg/m2  SpO2 96% Physical Exam Gen: well developed and well nourished appearing Head: NCAT Eyes: PERL, EOMI Nose: no epistaixis or rhinorrhea Mouth/throat: mucosa is moist and pink Neck: supple, no stridor Lungs: CTA B, no wheezing, rhonchi or rales CV: RRR, no murmur, extremities appear well perfused.  Abd: soft, notender, nondistended Back: no ttp, no cva ttp Skin: warm and dry Ext: right hand - abrasions with superficially avulsed skin over the dorsal aspect of PIP joints fingers 2-5, tender throughout  fingers and hand. Patient resists attempts and passive ROM and refuses to voluntarily range. NVI. RUE o/w wnl.  LUE: superficial abrasion over dorsal aspect of 2nd finger PIP with ttp over this and all digits diffusely as well as hand. LUE o/w wnl.  Neuro: CN ii-xii grossly intact, no focal deficits Psyche; anxious affect,  calm and cooperative.   ED Course  Procedures (including critical care time) Labs Review Labs Reviewed - No data to display  Imaging Review Dg Wrist Complete Left  05/22/2014   CLINICAL DATA:  Hand injury.  EXAM: LEFT WRIST - COMPLETE 3+ VIEW  COMPARISON:  None.  FINDINGS: The joint spaces are maintained. No acute wrist fracture is identified.  IMPRESSION: No acute bony findings.   Electronically Signed   By: Loralie Champagne M.D.   On: 05/22/2014 05:13   Dg Hand Complete Left  05/22/2014   CLINICAL DATA:  Left hand injury.  EXAM: LEFT HAND - COMPLETE 3+ VIEW  COMPARISON:  None.  FINDINGS: The joint spaces are maintained.  No acute fracture is identified.  IMPRESSION: No acute bony findings.   Electronically Signed   By: Loralie Champagne M.D.   On: 05/22/2014 05:14      MDM   DDX: fracture, dislocation, contusion, abrasion.   No fractures on plain films. We have irrigated wounds and we are treating acute pain in the ED.   Patient stable for discharge.   Brandt Loosen, MD  05/22/14 0716 

## 2014-05-22 NOTE — ED Notes (Signed)
othro tech at bedside

## 2014-05-22 NOTE — Discharge Instructions (Signed)
Abrasion An abrasion is a cut or scrape of the skin. Abrasions do not extend through all layers of the skin and most heal within 10 days. It is important to care for your abrasion properly to prevent infection. CAUSES  Most abrasions are caused by falling on, or gliding across, the ground or other surface. When your skin rubs on something, the outer and inner layer of skin rubs off, causing an abrasion. DIAGNOSIS  Your caregiver will be able to diagnose an abrasion during a physical exam.  TREATMENT  Your treatment depends on how large and deep the abrasion is. Generally, your abrasion will be cleaned with water and a mild soap to remove any dirt or debris. An antibiotic ointment may be put over the abrasion to prevent an infection. A bandage (dressing) may be wrapped around the abrasion to keep it from getting dirty.  You may need a tetanus shot if:  You cannot remember when you had your last tetanus shot.  You have never had a tetanus shot.  The injury broke your skin. If you get a tetanus shot, your arm may swell, get red, and feel warm to the touch. This is common and not a problem. If you need a tetanus shot and you choose not to have one, there is a rare chance of getting tetanus. Sickness from tetanus can be serious.  HOME CARE INSTRUCTIONS   If a dressing was applied, change it at least once a day or as directed by your caregiver. If the bandage sticks, soak it off with warm water.   Wash the area with water and a mild soap to remove all the ointment 2 times a day. Rinse off the soap and pat the area dry with a clean towel.   Reapply any ointment as directed by your caregiver. This will help prevent infection and keep the bandage from sticking. Use gauze over the wound and under the dressing to help keep the bandage from sticking.   Change your dressing right away if it becomes wet or dirty.   Only take over-the-counter or prescription medicines for pain, discomfort, or fever as  directed by your caregiver.   Follow up with your caregiver within 24 48 hours for a wound check, or as directed. If you were not given a wound-check appointment, look closely at your abrasion for redness, swelling, or pus. These are signs of infection. SEEK IMMEDIATE MEDICAL CARE IF:   You have increasing pain in the wound.   You have redness, swelling, or tenderness around the wound.   You have pus coming from the wound.   You have a fever or persistent symptoms for more than 2 3 days.  You have a fever and your symptoms suddenly get worse.  You have a bad smell coming from the wound or dressing.  MAKE SURE YOU:   Understand these instructions.  Will watch your condition.  Will get help right away if you are not doing well or get worse. Document Released: 09/12/2005 Document Revised: 11/19/2012 Document Reviewed: 11/06/2011 [facility] Patient Information 2014 [place].  Contusion A contusion is a deep bruise. Contusions happen when an injury causes bleeding under the skin. Signs of bruising include pain, puffiness (swelling), and discolored skin. The contusion may turn blue, purple, or yellow. HOME CARE   Put ice on the injured area.  Put ice in a plastic bag.  Place a towel between your skin and the bag.  Leave the ice on for 15-20 minutes, 03-04 times  a day.  Only take medicine as told by your doctor.  Rest the injured area.  If possible, raise (elevate) the injured area to lessen puffiness. GET HELP RIGHT AWAY IF:   You have more bruising or puffiness.  You have pain that is getting worse.  Your puffiness or pain is not helped by medicine. MAKE SURE YOU:   Understand these instructions.  Will watch your condition.  Will get help right away if you are not doing well or get worse. Document Released: 05/21/2008 Document Revised: 02/25/2012 Document Reviewed: 10/08/2011 [facility] Patient Information 2014 [place], [place].

## 2014-05-22 NOTE — ED Notes (Signed)
Returned from xray

## 2014-05-22 NOTE — ED Notes (Signed)
Pt hit brick wall PTA, c/o L hand & wrist pain, abrasions noted to knuckles, (R hand dominant), also reports "not able to move my hand" and numbness. Decreased ROM noted. Rates pain 10/10, no meds PTA.Marland Kitchen

## 2014-10-18 ENCOUNTER — Emergency Department (HOSPITAL_COMMUNITY)
Admission: EM | Admit: 2014-10-18 | Discharge: 2014-10-18 | Disposition: A | Discharge status: Home / Self Care | Payer: No Typology Code available for payment source | Attending: Emergency Medicine | Admitting: Emergency Medicine

## 2014-10-18 ENCOUNTER — Emergency Department (HOSPITAL_COMMUNITY): Payer: No Typology Code available for payment source

## 2014-10-18 ENCOUNTER — Encounter (HOSPITAL_COMMUNITY): Payer: Self-pay | Admitting: Emergency Medicine

## 2014-10-18 DIAGNOSIS — Z79899 Other long term (current) drug therapy: Secondary | ICD-10-CM | POA: Insufficient documentation

## 2014-10-18 DIAGNOSIS — J45909 Unspecified asthma, uncomplicated: Secondary | ICD-10-CM | POA: Insufficient documentation

## 2014-10-18 DIAGNOSIS — R059 Cough, unspecified: Secondary | ICD-10-CM

## 2014-10-18 DIAGNOSIS — R05 Cough: Secondary | ICD-10-CM

## 2014-10-18 DIAGNOSIS — E119 Type 2 diabetes mellitus without complications: Secondary | ICD-10-CM | POA: Insufficient documentation

## 2014-10-18 DIAGNOSIS — Z720 Tobacco use: Secondary | ICD-10-CM | POA: Insufficient documentation

## 2014-10-18 DIAGNOSIS — R079 Chest pain, unspecified: Secondary | ICD-10-CM | POA: Insufficient documentation

## 2014-10-18 MED ORDER — PREDNISONE 20 MG PO TABS
60.0000 mg | ORAL_TABLET | Freq: Once | ORAL | Status: AC
  Administered 2014-10-18: 60 mg via ORAL
  Filled 2014-10-18: qty 3

## 2014-10-18 MED ORDER — KETOROLAC TROMETHAMINE 30 MG/ML IJ SOLN
30.0000 mg | Freq: Once | INTRAMUSCULAR | Status: AC
  Administered 2014-10-18: 30 mg via INTRAMUSCULAR
  Filled 2014-10-18: qty 1

## 2014-10-18 MED ORDER — PREDNISONE 20 MG PO TABS: 60.0000 mg | ORAL_TABLET | Freq: Every day | ORAL | Status: AC

## 2014-10-18 NOTE — ED Notes (Signed)
Pt escorted to discharge window. Verbalized understanding discharge instructions. In no acute distress.   

## 2014-10-18 NOTE — ED Notes (Signed)
Patient transported to X-ray 

## 2014-10-18 NOTE — ED Notes (Addendum)
Pt c/o mid chest pain that is intermittent when he coughs.  Pt states that his cough is dry and non productive. Pt denies fever.

## 2014-10-18 NOTE — ED Provider Notes (Signed)
CSN: [id number]     Arrival date & time 10/18/14  [id number] History   First MD Initiated Contact with Patient 10/18/14 0830     Chief Complaint  Patient presents with  . Chest Pain  . Cough     HPI  Patient presents with concerns of cough, chest pain. Pain began within the past day, though the cough has been present for approximately 2 weeks. Pain is anterior, sore, nonradiating, worse with coughing. There is minimal pain when the patient is not coughing. The pain is not exertional nor pleuritic. There is no associated fever, chills, nausea, vomiting, diarrhea, abdominal pain. No wheezing. No relief with multiple OTC medications. Patient smokes, inconsistently.   Patient was advised to stop smoking, made aware of the importance of smoking cessation with his ongoing respiratory complaints.  Patient denies medical problems, but the patient's chart demonstrates a history of asthma.    Past Medical History  Diagnosis Date  . Asthma   . Diabetes mellitus without complication    History reviewed. No pertinent past surgical history. No family history on file. History  Substance Use Topics  . Smoking status: Current Every Day Smoker -- 0.01 packs/day for .2 years    Types: Cigarettes  . Smokeless tobacco: Never Used  . Alcohol Use: No    Review of Systems  Constitutional:       Per HPI, otherwise negative  HENT:       Per HPI, otherwise negative  Respiratory:       Per HPI, otherwise negative  Cardiovascular:       Per HPI, otherwise negative  Gastrointestinal: Negative for vomiting.  Endocrine:       Negative aside from HPI  Genitourinary:       Neg aside from HPI   Musculoskeletal:       Per HPI, otherwise negative  Skin: Negative.   Neurological: Negative for syncope.      Allergies  Banana; Coconut fatty acids; and Mushroom extract complex  Home Medications   Prior to Admission medications   Medication Sig Start Date End Date Taking? Authorizing Provider   bacitracin ointment Apply 1 application topically 2 (two) times daily. 05/22/14   Brandt LoosenJulie Manly, MD  ibuprofen (ADVIL,MOTRIN) 600 MG tablet Take 1 tablet (600 mg total) by mouth every 6 (six) hours as needed. 05/22/14   Brandt LoosenJulie Manly, MD   BP 126/81 mmHg  Pulse 64  Temp(Src) 97.8 F (36.6 C) (Oral)  Resp 16  SpO2 100% Physical Exam  Constitutional: He is oriented to person, place, and time. He appears well-developed. No distress.  HENT:  Head: Normocephalic and atraumatic.  Clear oropharynx, uvula midline, nonedematous  Eyes: Conjunctivae and EOM are normal.  Cardiovascular: Normal rate and regular rhythm.   Pulmonary/Chest: Effort normal. No stridor. No respiratory distress.  Abdominal: He exhibits no distension. There is no tenderness.  Musculoskeletal: He exhibits no edema.  Neurological: He is alert and oriented to person, place, and time.  Skin: Skin is warm and dry.  Psychiatric: He has a normal mood and affect.  Nursing note and vitals reviewed.   ED Course  Procedures (including critical care time) I reviewed the x-ray, interpretation.Patient is a notable curvature of the spine, but no obvious pneumonia  Pulse ox which 100% room air normal  On repeat exam the patient is in no distress.  Patient voices understanding of all results.   MDM  Well-appearing young male presents with ongoing cough, chest pain.  He has no history  of congenital cardiac disease, neither exertional nor pleuritic pain, and has regular rate and rhythm on physical exam, thus, there is low suspicion for cardiac etiology for his complaints. Patient has a history of asthma in the distant past, and this presentation is most consistent with bronchitis. Patient was started on anti-inflammatories, cough suppressants, steroids, discharged in stable condition.   Gerhard Munchobert Zaira Iacovelli, MD 10/18/14 [phone]

## 2014-10-18 NOTE — Discharge Instructions (Signed)
As discussed, your evaluation has been largely reassuring.  Your cough and pain are likely due to inflammation and irritation of the lungs and chest wall.  The single most important thing you can do is to stop smoking.  Please take all medication as directed, and be sure to follow-up with your primary care physician in one week to ensure appropriate ongoing care.  Return here for concerning changes in your condition.

## 2014-10-21 ENCOUNTER — Emergency Department (HOSPITAL_COMMUNITY): Payer: No Typology Code available for payment source

## 2014-10-21 ENCOUNTER — Encounter (HOSPITAL_COMMUNITY): Payer: Self-pay | Admitting: Emergency Medicine

## 2014-10-21 ENCOUNTER — Emergency Department (HOSPITAL_COMMUNITY)
Admission: EM | Admit: 2014-10-21 | Discharge: 2014-10-21 | Discharge status: Against Medical Advice | Payer: No Typology Code available for payment source | Attending: Emergency Medicine | Admitting: Emergency Medicine

## 2014-10-21 ENCOUNTER — Emergency Department (HOSPITAL_COMMUNITY)
Admission: EM | Admit: 2014-10-21 | Discharge: 2014-10-21 | Disposition: A | Discharge status: Home / Self Care | Payer: No Typology Code available for payment source | Attending: Emergency Medicine | Admitting: Emergency Medicine

## 2014-10-21 DIAGNOSIS — Z792 Long term (current) use of antibiotics: Secondary | ICD-10-CM | POA: Insufficient documentation

## 2014-10-21 DIAGNOSIS — Z720 Tobacco use: Secondary | ICD-10-CM | POA: Insufficient documentation

## 2014-10-21 DIAGNOSIS — T1490XA Injury, unspecified, initial encounter: Secondary | ICD-10-CM

## 2014-10-21 DIAGNOSIS — Y939 Activity, unspecified: Secondary | ICD-10-CM | POA: Insufficient documentation

## 2014-10-21 DIAGNOSIS — J45909 Unspecified asthma, uncomplicated: Secondary | ICD-10-CM | POA: Insufficient documentation

## 2014-10-21 DIAGNOSIS — Z791 Long term (current) use of non-steroidal anti-inflammatories (NSAID): Secondary | ICD-10-CM | POA: Insufficient documentation

## 2014-10-21 DIAGNOSIS — S60414A Abrasion of right ring finger, initial encounter: Secondary | ICD-10-CM | POA: Insufficient documentation

## 2014-10-21 DIAGNOSIS — T148XXA Other injury of unspecified body region, initial encounter: Secondary | ICD-10-CM

## 2014-10-21 DIAGNOSIS — E119 Type 2 diabetes mellitus without complications: Secondary | ICD-10-CM | POA: Insufficient documentation

## 2014-10-21 DIAGNOSIS — X58XXXA Exposure to other specified factors, initial encounter: Secondary | ICD-10-CM | POA: Insufficient documentation

## 2014-10-21 DIAGNOSIS — S60416A Abrasion of right little finger, initial encounter: Secondary | ICD-10-CM | POA: Insufficient documentation

## 2014-10-21 DIAGNOSIS — S6991XA Unspecified injury of right wrist, hand and finger(s), initial encounter: Secondary | ICD-10-CM

## 2014-10-21 DIAGNOSIS — Y9289 Other specified places as the place of occurrence of the external cause: Secondary | ICD-10-CM | POA: Insufficient documentation

## 2014-10-21 DIAGNOSIS — S60410A Abrasion of right index finger, initial encounter: Secondary | ICD-10-CM | POA: Insufficient documentation

## 2014-10-21 DIAGNOSIS — Y9389 Activity, other specified: Secondary | ICD-10-CM | POA: Insufficient documentation

## 2014-10-21 DIAGNOSIS — Y929 Unspecified place or not applicable: Secondary | ICD-10-CM | POA: Insufficient documentation

## 2014-10-21 DIAGNOSIS — S60412A Abrasion of right middle finger, initial encounter: Secondary | ICD-10-CM | POA: Insufficient documentation

## 2014-10-21 DIAGNOSIS — W2201XA Walked into wall, initial encounter: Secondary | ICD-10-CM | POA: Insufficient documentation

## 2014-10-21 MED ORDER — IBUPROFEN 800 MG PO TABS
800.0000 mg | ORAL_TABLET | Freq: Once | ORAL | Status: AC
  Administered 2014-10-21: 800 mg via ORAL
  Filled 2014-10-21: qty 1

## 2014-10-21 MED ORDER — IBUPROFEN 800 MG PO TABS: 800.0000 mg | ORAL_TABLET | Freq: Three times a day (TID) | ORAL | Status: DC

## 2014-10-21 NOTE — ED Notes (Signed)
Pt presents with injuries to R hand, abrasions to all four fingers, and swelling to R lateral dorsal hand. Full ROM

## 2014-10-21 NOTE — ED Notes (Signed)
Pt stated that he struck a piece of drywall with his r/fist early this am,. Pt was seen this am, then left without treatment Multiple shallow lacerations noted on  R/hand, at knuckles. R/5thfinger swollen at base

## 2014-10-21 NOTE — ED Provider Notes (Signed)
CSN: [id number]     Arrival date & time 10/21/14  1355 History  This chart was scribed for non-physician practitioner Junious SilkHannah Jaimee Corum, working with Suzi RootsKevin E Steinl, MD by Littie Deedsichard Sun, ED Scribe. This patient was seen in room WTR7/WTR7 and the patient's care was started at 3:01 PM.     Chief Complaint  Patient presents with  . Hand Injury    pt struck a drywall, shallow lacerations on knuckles noted  . Finger Injury    5thfinger , r/hand swollen   The history is provided by the patient. No language interpreter was used.   HPI Comments: Cody Navarro is a 18 y.o. right-handed male who presents to the Emergency Department complaining of sudden onset, constant right hand pain that began after an injury when the patient struck a drywall this morning around 2am. Patient had an XR done this morning, but left without being seen. He states that the pain is worse with movement. He describes the quality of his pain as throbbing. He has not taken anything taken for pain. Patient is UTD on tetanus; his last shot was about 2 years ago.    Past Medical History  Diagnosis Date  . Asthma   . Diabetes mellitus without complication    History reviewed. No pertinent past surgical history. Family History  Problem Relation Age of Onset  . Diabetes Other    History  Substance Use Topics  . Smoking status: Current Every Day Smoker -- 0.01 packs/day for .2 years    Types: Cigarettes  . Smokeless tobacco: Never Used  . Alcohol Use: Yes    Review of Systems  Musculoskeletal: Positive for myalgias and arthralgias.  Skin: Positive for wound.  All other systems reviewed and are negative.     Allergies  Banana; Coconut fatty acids; and Mushroom extract complex  Home Medications   Prior to Admission medications   Medication Sig Start Date End Date Taking? Authorizing Provider  bacitracin ointment Apply 1 application topically 2 (two) times daily. 05/22/14   Brandt LoosenJulie Manly, MD  IBUPROFEN PO Take 500 mg by  mouth daily as needed (headache).    Historical Provider, MD  predniSONE (DELTASONE) 20 MG tablet Take 3 tablets (60 mg total) by mouth daily with breakfast. 10/19/14 10/22/14  Gerhard Munchobert Lockwood, MD  pseudoephedrine-guaifenesin [facility](MUCINEX D) 60-600 MG per tablet Take 1 tablet by mouth every 12 (twelve) hours.    Historical Provider, MD   BP 117/73 mmHg  Pulse 88  Temp(Src) 99.1 F (37.3 C) (Oral)  Resp 16  SpO2 98% Physical Exam  Constitutional: He is oriented to person, place, and time. He appears well-developed and well-nourished. No distress.  HENT:  Head: Normocephalic and atraumatic.  Right Ear: External ear normal.  Left Ear: External ear normal.  Nose: Nose normal.  Eyes: Conjunctivae are normal.  Neck: Normal range of motion. No tracheal deviation present.  Cardiovascular: Normal rate, regular rhythm and normal heart sounds.   Pulmonary/Chest: Effort normal and breath sounds normal. No stridor.  Abdominal: Soft. He exhibits no distension. There is no tenderness.  Musculoskeletal: Normal range of motion. He exhibits tenderness.  TTP to 5th MCP of right hand. Compartment soft.  Grip strength 5/5  Neurological: He is alert and oriented to person, place, and time.  Neurovascularly intact  Skin: Skin is warm and dry. Abrasion noted. He is not diaphoretic.  Abrasions to PIP on 3rd, 4th, and 5th fingers of right hand.   Psychiatric: He has a normal mood and affect. His  behavior is normal.  Nursing note and vitals reviewed.   ED Course  Procedures  DIAGNOSTIC STUDIES: Oxygen Saturation is 98% on room air, normal by my interpretation.    COORDINATION OF CARE: 3:01 PM-Discussed treatment plan which includes ibuprofen and wrist splint with pt at bedside and pt agreed to plan.    Labs Review Labs Reviewed - No data to display  Imaging Review Dg Hand Complete Right  10/21/2014   CLINICAL DATA:  Struck a wall with his fist at home.  EXAM: RIGHT HAND - COMPLETE 3+ VIEW  COMPARISON:   None.  FINDINGS: No acute fracture deformity or dislocation. Joint space intact without erosions. No destructive bony lesions. Soft tissue planes are not suspicious.  IMPRESSION: Negative.   Electronically Signed   By: Awilda Metroourtnay  Bloomer   On: 10/21/2014 04:39     EKG Interpretation None      MDM   Final diagnoses:  Hand injury, right, initial encounter  Abrasion   Patient presents to emergency department after punching a wall earlier this morning. X-ray is negative for acute pathology. Tetanus is up-to-date. Patient was given a wrist splint for comfort. Encouraged NSAIDs, ice. Discussed reasons to return to emergency department. Vital signs stable for discharge. Patient / Family / Caregiver informed of clinical course, understand medical decision-making process, and agree with plan.   I personally performed the services described in this documentation, which was scribed in my presence. The recorded information has been reviewed and is accurate.    Mora BellmanHannah S Dois Juarbe, PA-C 10/21/14 1536  Suzi RootsKevin E Steinl, MD 10/21/14 [phone]

## 2014-10-21 NOTE — ED Notes (Signed)
No answer in WR

## 2014-10-21 NOTE — Discharge Instructions (Signed)
Your x-rays showed that there were no broken bones in your hand. Wear the wrist splint for comfort. Return to the emergency department for new or worsening symptoms.

## 2014-10-21 NOTE — ED Notes (Signed)
Patient was eating a plate of food and I advised him that he should not eat or drink until after he sees the MD

## 2014-10-22 ENCOUNTER — Encounter (HOSPITAL_BASED_OUTPATIENT_CLINIC_OR_DEPARTMENT_OTHER): Payer: Self-pay | Admitting: Emergency Medicine

## 2014-12-06 ENCOUNTER — Emergency Department (HOSPITAL_COMMUNITY)
Admission: EM | Admit: 2014-12-06 | Discharge: 2014-12-06 | Disposition: A | Discharge status: Home / Self Care | Payer: No Typology Code available for payment source | Attending: Emergency Medicine | Admitting: Emergency Medicine

## 2014-12-06 ENCOUNTER — Encounter (HOSPITAL_COMMUNITY): Payer: Self-pay | Admitting: *Deleted

## 2014-12-06 DIAGNOSIS — Y998 Other external cause status: Secondary | ICD-10-CM | POA: Insufficient documentation

## 2014-12-06 DIAGNOSIS — Y9289 Other specified places as the place of occurrence of the external cause: Secondary | ICD-10-CM | POA: Insufficient documentation

## 2014-12-06 DIAGNOSIS — E119 Type 2 diabetes mellitus without complications: Secondary | ICD-10-CM | POA: Insufficient documentation

## 2014-12-06 DIAGNOSIS — Z792 Long term (current) use of antibiotics: Secondary | ICD-10-CM | POA: Insufficient documentation

## 2014-12-06 DIAGNOSIS — Z720 Tobacco use: Secondary | ICD-10-CM | POA: Insufficient documentation

## 2014-12-06 DIAGNOSIS — Y9389 Activity, other specified: Secondary | ICD-10-CM | POA: Insufficient documentation

## 2014-12-06 DIAGNOSIS — Z79899 Other long term (current) drug therapy: Secondary | ICD-10-CM | POA: Insufficient documentation

## 2014-12-06 DIAGNOSIS — S335XXA Sprain of ligaments of lumbar spine, initial encounter: Secondary | ICD-10-CM

## 2014-12-06 DIAGNOSIS — X58XXXA Exposure to other specified factors, initial encounter: Secondary | ICD-10-CM | POA: Insufficient documentation

## 2014-12-06 DIAGNOSIS — J45909 Unspecified asthma, uncomplicated: Secondary | ICD-10-CM | POA: Insufficient documentation

## 2014-12-06 DIAGNOSIS — S339XXA Sprain of unspecified parts of lumbar spine and pelvis, initial encounter: Secondary | ICD-10-CM | POA: Insufficient documentation

## 2014-12-06 MED ORDER — CYCLOBENZAPRINE HCL 10 MG PO TABS: 10.0000 mg | ORAL_TABLET | Freq: Two times a day (BID) | ORAL | Status: DC | PRN

## 2014-12-06 MED ORDER — IBUPROFEN 800 MG PO TABS: 800.0000 mg | ORAL_TABLET | Freq: Three times a day (TID) | ORAL | Status: DC

## 2014-12-06 NOTE — ED Notes (Signed)
Pt reports he has had lower back pain for some time due to his job at [employer]. Reports back pain has increased x1 week. 0/10 pain at present. But 7/10 pain when bending. Denies dysuria. Denies bowel or urine incontinence.

## 2014-12-06 NOTE — Discharge Instructions (Signed)
Back Pain, Adult °Low back pain is very common. About 1 in 5 people have back pain. The cause of low back pain is rarely dangerous. The pain often gets better over time. About half of people with a sudden onset of back pain feel better in just 2 weeks. About 8 in 10 people feel better by 6 weeks.  °CAUSES °Some common causes of back pain include: °· Strain of the muscles or ligaments supporting the spine. °· Wear and tear (degeneration) of the spinal discs. °· Arthritis. °· Direct injury to the back. °DIAGNOSIS °Most of the time, the direct cause of low back pain is not known. However, back pain can be treated effectively even when the exact cause of the pain is unknown. Answering your caregiver's questions about your overall health and symptoms is one of the most accurate ways to make sure the cause of your pain is not dangerous. If your caregiver needs more information, he or she may order lab work or imaging tests (X-rays or MRIs). However, even if imaging tests show changes in your back, this usually does not require surgery. °HOME CARE INSTRUCTIONS °For many people, back pain returns. Since low back pain is rarely dangerous, it is often a condition that people can learn to manage on their own.  °· Remain active. It is stressful on the back to sit or stand in one place. Do not sit, drive, or stand in one place for more than 30 minutes at a time. Take short walks on level surfaces as soon as pain allows. Try to increase the length of time you walk each day. °· Do not stay in bed. Resting more than 1 or 2 days can delay your recovery. °· Do not avoid exercise or work. Your body is made to move. It is not dangerous to be active, even though your back may hurt. Your back will likely heal faster if you return to being active before your pain is gone. °· Pay attention to your body when you  bend and lift. Many people have less discomfort when lifting if they bend their knees, keep the load close to their bodies, and  avoid twisting. Often, the most comfortable positions are those that put less stress on your recovering back. °· Find a comfortable position to sleep. Use a firm mattress and lie on your side with your knees slightly bent. If you lie on your back, put a pillow under your knees. °· Only take over-the-counter or prescription medicines as directed by your caregiver. Over-the-counter medicines to reduce pain and inflammation are often the most helpful. Your caregiver may prescribe muscle relaxant drugs. These medicines help dull your pain so you can more quickly return to your normal activities and healthy exercise. °· Put ice on the injured area. °¨ Put ice in a plastic bag. °¨ Place a towel between your skin and the bag. °¨ Leave the ice on for 15-20 minutes, 03-04 times a day for the first 2 to 3 days. After that, ice and heat may be alternated to reduce pain and spasms. °· Ask your caregiver about trying back exercises and gentle massage. This may be of some benefit. °· Avoid feeling anxious or stressed. Stress increases muscle tension and can worsen back pain. It is important to recognize when you are anxious or stressed and learn ways to manage it. Exercise is a great option. °SEEK MEDICAL CARE IF: °· You have pain that is not relieved with rest or medicine. °· You have pain that does not improve in 1 week. °· You have new symptoms. °· You are generally not feeling well. °SEEK   IMMEDIATE MEDICAL CARE IF:  °· You have pain that radiates from your back into your legs. °· You develop new bowel or bladder control problems. °· You have unusual weakness or numbness in your arms or legs. °· You develop nausea or vomiting. °· You develop abdominal pain. °· You feel faint. °Document Released: 12/03/2005 Document Revised: 06/03/2012 Document Reviewed: 04/06/2014 °ExitCare® Patient Information ©2015 [employer]. This information is not intended to replace advice given to you by your health care provider. Make sure you  discuss any questions you have with your health care provider. ° ° °Back Exercises °Back exercises help treat and prevent back injuries. The goal of back exercises is to increase the strength of your abdominal and back muscles and the flexibility of your back. These exercises should be started when you no longer have back pain. Back exercises include: °· Pelvic Tilt. Lie on your back with your knees bent. Tilt your pelvis until the lower part of your back is against the floor. Hold this position 5 to 10 sec and repeat 5 to 10 times. °· Knee to Chest. Pull first 1 knee up against your chest and hold for 20 to 30 seconds, repeat this with the other knee, and then both knees. This may be done with the other leg straight or bent, whichever feels better. °· Sit-Ups or Curl-Ups. Bend your knees 90 degrees. Start with tilting your pelvis, and do a partial, slow sit-up, lifting your trunk only 30 to 45 degrees off the floor. Take at least 2 to 3 seconds for each sit-up. Do not do sit-ups with your knees out straight. If partial sit-ups are difficult, simply do the above but with only tightening your abdominal muscles and holding it as directed. °· Hip-Lift. Lie on your back with your knees flexed 90 degrees. Push down with your feet and shoulders as you raise your hips a couple inches off the floor; hold for 10 seconds, repeat 5 to 10 times. °· Back arches. Lie on your stomach, propping yourself up on bent elbows. Slowly press on your hands, causing an arch in your low back. Repeat 3 to 5 times. Any initial stiffness and discomfort should lessen with repetition over time. °· Shoulder-Lifts. Lie face down with arms beside your body. Keep hips and torso pressed to floor as you slowly lift your head and shoulders off the floor. °Do not overdo your exercises, especially in the beginning. Exercises may cause you some mild back discomfort which lasts for a few minutes; however, if the pain is more severe, or lasts for more than  15 minutes, do not continue exercises until you see your caregiver. Improvement with exercise therapy for back problems is slow.  °See your caregivers for assistance with developing a proper back exercise program. °Document Released: 01/10/2005 Document Revised: 02/25/2012 Document Reviewed: 10/04/2011 °ExitCare® Patient Information ©2015 [employer]. This information is not intended to replace advice given to you by your health care provider. Make sure you discuss any questions you have with your health care provider. ° ° °Emergency Department Resource Guide °1) Find a Doctor and Pay Out of Pocket °Although you won't have to find out who is covered by your insurance plan, it is a good idea to ask around and get recommendations. You will then need to call the office and see if the doctor you have chosen will accept you as a new patient and what types of options they offer for patients who are self-pay. Some doctors offer discounts or   will set up payment plans for their patients who do not have insurance, but you will need to ask so you aren't surprised when you get to your appointment. ° °2) Contact Your Local Health Department °Not all health departments have doctors that can see patients for sick visits, but many do, so it is worth a call to see if yours does. If you don't know where your local health department is, you can check in your phone book. The CDC also has a tool to help you locate your state's health department, and many state websites also have listings of all of their local health departments. ° °3) Find a Walk-in Clinic °If your illness is not likely to be very severe or complicated, you may want to try a walk in clinic. These are popping up all over the country in pharmacies, drugstores, and shopping centers. They're usually staffed by nurse practitioners or physician assistants that have been trained to treat common illnesses and complaints. They're usually fairly quick and inexpensive. However,  if you have serious medical issues or chronic medical problems, these are probably not your best option. ° °No Primary Care Doctor: °- Call [facility] at  832-8000 - they can help you locate a primary care doctor that  accepts your insurance, provides certain services, etc. °- Physician Referral Service- 1-800-533-3463 ° °Chronic Pain Problems: °Organization         Address  Phone   Notes  °[facility]  (336) 297-2271 Patients need to be referred by their primary care doctor.  ° °Medication Assistance: °Organization         Address  Phone   Notes  °[place] [facility] [address] °[place], [place] [address] (336) 641-8030 --Must be a resident of [place] °-- Must have NO insurance coverage whatsoever (no Medicaid/ Medicare, etc.) °-- The pt. MUST have a primary care doctor that directs their care regularly and follows them in the community °  °[facility]  (866) 331-1348   °[employer]  (888) 892-1162   ° °Agencies that provide inexpensive medical care: °Organization         Address  Phone   Notes  °[facility]  (336) 832-8035   °[facility]    (336) 832-7272   °[facility] [address] °[place], [place] [address] (336) 832-4777   °[facility] [address], °[place] (336) 271-4999   °[facility]    (336) 373-0678   °[facility]    (336) 272-1050   °[facility] ° [address], [place] Phone:  (336) 832-4444, Fax:  (336) 832-4440 Hours of Operation:  9 am - 6 pm, M-F.  Also accepts Medicaid/Medicare and self-pay.  °[facility] ° [address], [place] Phone: (336) 832-3150, Fax: (336) 832-3151. Hours of Operation:  8:30 am - 5:30 pm, M-F.  Also accepts Medicaid and self-pay.  °[facility] [place] [address], [place] Phone: (336) 878-6027   °[facility] [address], [place], [place] (336)723-1848, Ext. 123 Mondays & Thursdays: 7-9 AM.  First 15 patients are seen on a first come, first serve basis. °  ° °Medicaid-accepting [place] Providers: ° °Organization         Address  Phone   Notes  °[facility] [address],  (336)   641-2100 Also accepts self-pay patients.  °[facility] [address], [place] ° (336) 856-9996   °[facility] [address], [place] (336) 288-8857   °[facility] [address] [place] [address], [place] (336) 299-7000   °Veita Bland [address], [place]  ° (336) 373-1557 Only accepts [place] Access Medicaid patients after they have their name applied to their card.  ° °Self-Pay (no insurance) in [place]: ° °Organization         Address  Phone   Notes  °Sickle Cell Patients, [facility] [address], [place] (336) 832-1970   °[facility] [address], [place] (336) 832-4400   °[facility] ° [address], [place] (336) 992-4800   °[facility]/Dr. Osei-Bonsu ° [address] [place] [address], [place] or [address], [place] (336) 841-8500 Phone number for both [place] and [place] locations is the same.  °[facility] [address], [place] (336) 299-0000   °[facility] [address] [place] [address], [place] or [address] (336) 852-7530 °(336) 878-2260   °[facility] [address], [place] (336) 350-1642, phone; (336) 294-5005, fax Sees patients 1st and 3rd Saturday of every month.  Must not qualify for public or private insurance (i.e. Medicaid, Medicare, [place] Health Choice, Veterans' Benefits) • Household income should be no more than 200% of the poverty level •The clinic cannot treat you if you are pregnant or think you are  pregnant • Sexually transmitted diseases are not treated at the clinic.  ° ° °Dental Care: °Organization         Address  Phone  Notes  °[facility] [facility] [address],  (336) 641-6152 Accepts children up to age 21 who are enrolled in Medicaid or [place] Health Choice; pregnant women with a Medicaid card; and children who have applied for Medicaid or [place] Health Choice, but were declined, whose parents can pay a reduced fee at time of service.  °[facility] [place]  [address], [place] (336) 641-7733 Accepts children up to age 21 who are enrolled in Medicaid or [place] Health Choice; pregnant women with a Medicaid card; and children who have applied for Medicaid or [place] Health Choice, but were declined, whose parents can pay a reduced fee at time of service.  °[facility] ° [address],  (336) 641-4533 Patients are seen by appointment only. Walk-ins are not accepted. [facility] will see patients 18 years of age and older. °Monday - Tuesday (8am-5pm) °Most Wednesdays (8:30-5pm) °$30 per visit, cash only  °[facility] ° [address], [place] (336) 641-4533 Patients are seen by appointment only. Walk-ins are not accepted. [facility] will see patients 18 years of age and older. °One Wednesday Evening (Monthly: Volunteer Based).  $30 per visit, cash only  °[facility]  (919) 537-3737 for adults; Children under age 4, call Graduate Pediatric Dentistry at (919) [phone]. Children aged 4-14, please call (919) [phone] to request a pediatric application. ° Dental services are provided in all areas of dental care including fillings, crowns and bridges, complete and partial dentures, implants, gum treatment, root canals, and extractions. Preventive care is also provided. Treatment   is provided to both adults and  children. °Patients are selected via a lottery and there is often a waiting list. °  °[facility] [address], °[place] ° (336) 763-8833 [email or web address] °  °[facility] [address], [place], [place] (336)723-1848, Ext. 123 Second and Fourth Thursday of each month, opens at 6:30 AM; Clinic ends at 9 AM.  Patients are seen on a first-come first-served basis, and a limited number are seen during each clinic.  ° °[facility] ° [address], [place], [place] (336) 723-7904   Eligibility Requirements °You must have lived in [place], [place], or [place] counties for at least the last three months. °  You cannot be eligible for state or federal sponsored healthcare insurance, including Veterans Administration, Medicaid, or Medicare. °  You generally cannot be eligible for healthcare insurance through your employer.  °  How to apply: °Eligibility screenings are held every Tuesday and Wednesday afternoon from 1:00 pm until 4:00 pm. You do not need an appointment for the interview!  °[facility] [address], [place], [place] 336-631-2330   °[facility]  336-342-8273   °[facility]  336-703-3100   °[facility]  336-570-6415   ° °Behavioral Health Resources in the Community: °Intensive Outpatient Programs °Organization         Address  Phone  Notes  °[facility] [address], [place], [place] 336-878-6098   °[facility] [address], [place], [place] 336-832-9800   °[facility] [address], [place], [place] ° 336-882-2125   °[facility] [address],  °[place], [place] 1-800-853-5163 or 336-641-4981   °Substance Abuse Resources °Organization         Address  Phone  Notes  °[facility]  336-882-2125   °[facility]  336-784-9470   °[facility]  336-285-9073    °[facility]  336-845-3988   °Residential & Outpatient Substance Abuse Program  1-800-659-3381   °Psychological Services °Organization         Address  Phone  Notes  °[facility]  336- 832-9600   °[facility]  336- 378-7881   °[facility] [address], [place] 1-800-853-5163 or 336-641-4981   ° °Mobile Crisis Teams °Organization         Address  Phone  Notes  °[facility], Mobile Crisis Care Unit  1-877-626-1772   °Assertive °[facility] ° [address] [place], [place] 336-834-9664   °Sharon DeEsch [address] °[place] 336-554-5454   ° °Self-Help/Support Groups °Organization         Address  Phone             Notes  °[facility] - variety of support groups  336- 373-1402 Call for more information  °Narcotics Anonymous (NA), [facility] [address], °[place]  2 meetings at this location  ° °Residential Treatment Programs °Organization         Address  Phone  Notes  °[facility] [address],    °[place]  1-866-801-8205   °[facility] ° [address], [place], [place] 704-293-8524   °[facility] [address], [place] 336-845-3988 Admissions: 8am-3pm M-F  °[facility] [address]    °[place],   [place] 336-841-1104   °[facility] [address], [place], [place] 336-379-7146   °[facility] [address]  °[place], [place] 336-285-9073   °[facility] Intensive Outpatient [address], [place], [place] 336-852-3033   °[facility] [address]  °[place], [place] 1-877-615-2722 or 336-784-9470   °[facility] [address] [place], [place] 336-227-7417 Accepts Medicaid  °[facility] [address]  °[place] 1-800-659-3381 Substance Abuse/Addiction Treatment  ° °[place]  Behavioral Health Resources °Organization         Address  Phone  Notes  °[facility]  (888) 581-9988   °Julie Brannon, PhD [address] [place], [place]   (336) 349-5553 or (336) 951-0000   °[facility]   [address] °[place], [place] (336) 349-4454   °[facility] [address], [place], [place] (336) 342-8316 Insurance/Medicaid/sponsorship through [facility]  °[facility] [address]                                    [place], [place] (336) 342-8316 Therapy/tele-psych/case  °[facility] [address]  ° [place], [place] (336) 349-2233    °Dr. Arfeen  (336) 349-4544   °[facility] [place]  [employer] [place] [facility] 1) [address], [place] °2) [address], [place] °3)  [address], [place] (336) 349-3220 °(336) 342-7768 ° °(336) 342-8140   °[place] [facility] (336) 342-1394 or (336) 342-3537 (After Hours)    ° ° ° °

## 2014-12-06 NOTE — ED Provider Notes (Signed)
CSN: [id number]     Arrival date & time 12/06/14  1815 History  This chart was scribed for non-physician practitioner, Ladona MowJoe Linden Mikes, PA-C working with Arby BarretteMarcy Pfeiffer, MD by Greggory StallionKayla Andersen, ED scribe. This patient was seen in room WTR6/WTR6 and the patient's care was started at 7:44 PM.    Chief Complaint  Patient presents with  . Back Pain   The history is provided by the patient. No language interpreter was used.    HPI Comments: Cody Navarro is a 18 y.o. male who presents to the Emergency Department complaining of lower back pain that started 2 weeks ago and worsened one week ago. Pain does not radiate. Denies injury but states he works at [employer] and does a lot of heavy lifting. Rest and laying flat relieves pain but bending and movements worsens it. Denies fever, bowel or bladder incontinence, saddle anesthesia, weakness. Denies history of cancer or IV drug use. Pt does not have a PCP.   Past Medical History  Diagnosis Date  . Asthma   . Diabetes mellitus without complication    History reviewed. No pertinent past surgical history. Family History  Problem Relation Age of Onset  . Diabetes Other    History  Substance Use Topics  . Smoking status: Current Every Day Smoker -- 0.01 packs/day for .2 years    Types: Cigarettes  . Smokeless tobacco: Never Used  . Alcohol Use: Yes    Review of Systems  Constitutional: Negative for fever.  Genitourinary:       Negative for bowel or bladder incontinence.  Musculoskeletal: Positive for back pain.  Neurological: Negative for weakness.  All other systems reviewed and are negative.  Allergies  Banana; Coconut fatty acids; and Mushroom extract complex  Home Medications   Prior to Admission medications   Medication Sig Start Date End Date Taking? Authorizing Provider  bacitracin ointment Apply 1 application topically 2 (two) times daily. 05/22/14   Brandt LoosenJulie Manly, MD  cyclobenzaprine (FLEXERIL) 10 MG tablet Take 1 tablet (10 mg total) by  mouth 2 (two) times daily as needed for muscle spasms. 12/06/14   Monte FantasiaJoseph W Ronnett Pullin, PA-C  ibuprofen (ADVIL,MOTRIN) 800 MG tablet Take 1 tablet (800 mg total) by mouth 3 (three) times daily. 12/06/14   Monte FantasiaJoseph W Jonnell Hentges, PA-C  pseudoephedrine-guaifenesin [facility](MUCINEX D) 60-600 MG per tablet Take 1 tablet by mouth every 12 (twelve) hours.    Historical Provider, MD   BP 115/80 mmHg  Pulse 82  Temp(Src) 98 F (36.7 C) (Oral)  Resp 16  SpO2 99%   Physical Exam  Constitutional: He is oriented to person, place, and time. He appears well-developed and well-nourished. No distress.  HENT:  Head: Normocephalic and atraumatic.  Eyes: Conjunctivae and EOM are normal.  Neck: Neck supple. No tracheal deviation present.  Cardiovascular: Normal rate.   Pulmonary/Chest: Effort normal. No respiratory distress.  Musculoskeletal: Normal range of motion.  Spinous and paraspinous process tenderness from L3 region to S1 region. Normal range of motion with active and passive range of motion. No obvious deformities, step-offs, erythema, edema, signs of injury  Neurological: He is alert and oriented to person, place, and time. He has normal strength. No cranial nerve deficit or sensory deficit. He displays a negative Romberg sign. Gait normal. GCS eye subscore is 4. GCS verbal subscore is 5. GCS motor subscore is 6.  Patient fully alert answering questions appropriately in full, clear sentences. Cranial nerves II through XII grossly intact. Motor strength 5 out of 5 in all  major muscle groups of upper and lower extremities. Distal sensation intact.  Skin: Skin is warm and dry.  Psychiatric: He has a normal mood and affect. His behavior is normal.  Nursing note and vitals reviewed.   ED Course  Procedures (including critical care time)  DIAGNOSTIC STUDIES: Oxygen Saturation is 99% on RA, normal by my interpretation.    COORDINATION OF CARE: 7:50 PM-Advised pt imaging is not necessary at this time and he agrees.  Discussed treatment plan which includes rest, ibuprofen, a muscle relaxer and back exercises with pt at bedside and pt agreed to plan. Will give pt PCP referrals and advised him to follow up. Return precautions given.   Labs Review Labs Reviewed - No data to display  Imaging Review No results found.   EKG Interpretation None      MDM   Final diagnoses:  Lumbar back sprain, initial encounter   Patient with back pain.  No neurological deficits and normal neuro exam.  Patient can walk but states is painful.  No loss of bowel or bladder control.  No concern for cauda equina.  No fever, night sweats, weight loss, h/o cancer, IVDU.  RICE protocol and pain medicine indicated and discussed with patient.   I personally performed the services described in this documentation, which was scribed in my presence. The recorded information has been reviewed and is accurate.  BP 115/80 mmHg  Pulse 82  Temp(Src) 98 F (36.7 C) (Oral)  Resp 16  SpO2 99%  Signed,  Ladona MowJoe Thadd Apuzzo, PA-C 7:58 PM   Monte FantasiaJoseph W Asyah [place], PA-C 12/06/14 1959  Arby BarretteMarcy Pfeiffer, MD 12/06/14 [phone]

## 2014-12-06 NOTE — ED Notes (Signed)
AVS explained in detail. Knows not to drink/drive with medications. Work note given. No other questions/concerns.

## 2015-01-03 ENCOUNTER — Emergency Department (HOSPITAL_COMMUNITY)
Admission: EM | Admit: 2015-01-03 | Discharge: 2015-01-03 | Disposition: A | Discharge status: Home / Self Care | Payer: No Typology Code available for payment source | Attending: Emergency Medicine | Admitting: Emergency Medicine

## 2015-01-03 ENCOUNTER — Encounter (HOSPITAL_COMMUNITY): Payer: Self-pay | Admitting: Emergency Medicine

## 2015-01-03 DIAGNOSIS — J45909 Unspecified asthma, uncomplicated: Secondary | ICD-10-CM | POA: Insufficient documentation

## 2015-01-03 DIAGNOSIS — Z720 Tobacco use: Secondary | ICD-10-CM | POA: Insufficient documentation

## 2015-01-03 DIAGNOSIS — E119 Type 2 diabetes mellitus without complications: Secondary | ICD-10-CM | POA: Insufficient documentation

## 2015-01-03 DIAGNOSIS — R21 Rash and other nonspecific skin eruption: Secondary | ICD-10-CM | POA: Insufficient documentation

## 2015-01-03 DIAGNOSIS — Z792 Long term (current) use of antibiotics: Secondary | ICD-10-CM | POA: Insufficient documentation

## 2015-01-03 DIAGNOSIS — Z791 Long term (current) use of non-steroidal anti-inflammatories (NSAID): Secondary | ICD-10-CM | POA: Insufficient documentation

## 2015-01-03 MED ORDER — PREDNISONE 50 MG PO TABS: ORAL_TABLET | ORAL | Status: DC

## 2015-01-03 MED ORDER — PREDNISONE 20 MG PO TABS
60.0000 mg | ORAL_TABLET | Freq: Once | ORAL | Status: AC
  Administered 2015-01-03: 60 mg via ORAL
  Filled 2015-01-03: qty 3

## 2015-01-03 NOTE — ED Notes (Signed)
Pt c/o rash that started about week ago on arms. No rash is on back, chest, legs. Pt states that it's itching, as he is scratching  His arms and legs. Pt states that he switched soaps about two weeks ago, which about the same time as rash broke up.

## 2015-01-03 NOTE — Discharge Instructions (Signed)
1 to 2 tablets of 25 mg Benadryl pills every 4-6 hours as needed to a maximum of 300 mg per day. In addition, you may apply a topical hydrocortisone ointment to all affected areas except for the face.  ° °Do not hesitate to call 911 or return to the emergency room if you develop any shortness of breath, wheezing, tongue or lip swelling. ° °

## 2015-01-03 NOTE — ED Provider Notes (Signed)
CSN: [id number]     Arrival date & time 01/03/15  1420 History  This chart was scribed for non-physician practitioner working with Toy Baker, MD by Elveria Rising, ED Scribe. This patient was seen in room WTR6/WTR6 and the patient's care was started at 3:03 PM.   Chief Complaint  Patient presents with  . Rash   The history is provided by the patient. No language interpreter was used.   HPI Comments: Cody Navarro is a 19 y.o. male who presents to the Emergency Department complaining of pruritic rash to bilateral extremities, chest, abdomen and back onset two weeks ago. Patient reports the rash initially presented near his left wrist and has spread to aforementioned areas. Patient reports attempted treatment with Benadryl, but he only reports temporary relief. Patient reports that warm showers soothe his itching. Patient reports new scent of soap and detergent within the same brand, two weeks ago which is around the time his rash developed. Patient denies additional medical complaints including lightheadedness, dizziness, fever, chills, shortness of breath, or chest pain. Patient reports being exposed to "hazardous material" at work: [employer]. Patient history of Diabetes Mellitus Type II controlled with diet and exercise induced asthma.   Past Medical History  Diagnosis Date  . Asthma   . Diabetes mellitus without complication    History reviewed. No pertinent past surgical history. Family History  Problem Relation Age of Onset  . Diabetes Other    History  Substance Use Topics  . Smoking status: Current Every Day Smoker -- 0.01 packs/day for .2 years    Types: Cigarettes  . Smokeless tobacco: Never Used  . Alcohol Use: Yes    Review of Systems  Constitutional: Negative for fever and chills.  Respiratory: Negative for shortness of breath.   Cardiovascular: Negative for chest pain.  Gastrointestinal: Negative for nausea, vomiting and diarrhea.  Genitourinary: Negative for  penile pain.  Skin: Positive for rash.    Allergies  Banana; Coconut fatty acids; and Mushroom extract complex  Home Medications   Prior to Admission medications   Medication Sig Start Date End Date Taking? Authorizing Provider  bacitracin ointment Apply 1 application topically 2 (two) times daily. 05/22/14   Brandt Loosen, MD  cyclobenzaprine (FLEXERIL) 10 MG tablet Take 1 tablet (10 mg total) by mouth 2 (two) times daily as needed for muscle spasms. 12/06/14   Monte Fantasia, PA-C  ibuprofen (ADVIL,MOTRIN) 800 MG tablet Take 1 tablet (800 mg total) by mouth 3 (three) times daily. 12/06/14   Monte Fantasia, PA-C  pseudoephedrine-guaifenesin [facility] D) 60-600 MG per tablet Take 1 tablet by mouth every 12 (twelve) hours.    Historical Provider, MD   Triage Vitals: BP 118/72 mmHg  Pulse 71  Temp(Src) 98.4 F (36.9 C) (Oral)  Resp 16  SpO2 100% Physical Exam  Constitutional: He is oriented to person, place, and time. He appears well-developed and well-nourished. No distress.  HENT:  Head: Normocephalic and atraumatic.  Eyes: EOM are normal.  Neck: Neck supple. No tracheal deviation present.  Cardiovascular: Normal rate.   Pulmonary/Chest: Effort normal. No respiratory distress.  Musculoskeletal: Normal range of motion.  Neurological: He is alert and oriented to person, place, and time.  Skin: Skin is warm and dry. Rash noted.  Papular rash mildly excoriated no warmth, no erythema or discharge. Rash spares the palms soles or mucous membranes, it is blanchable, not petechial.  Psychiatric: He has a normal mood and affect. His behavior is normal.  Nursing note  and vitals reviewed.   ED Course  Procedures (including critical care time)  COORDINATION OF CARE: 3:12 PM- Discussed treatment plan with patient at bedside and patient agreed to plan.   Labs Review Labs Reviewed - No data to display  Imaging Review No results found.   EKG Interpretation None      MDM   Final  diagnoses:  None    Filed Vitals:   01/03/15 1427  BP: 118/72  Pulse: 71  Temp: 98.4 F (36.9 C)  TempSrc: Oral  Resp: 16  SpO2: 100%    Medications  predniSONE (DELTASONE) tablet 60 mg (not administered)    Cody Navarro is a pleasant 19 y.o. male presenting with rash, no red flags, pruritic in nature. Will treat with prednisone and give dermatology follow-up.  Evaluation does not show pathology that would require ongoing emergent intervention or inpatient treatment. Pt is hemodynamically stable and mentating appropriately. Discussed findings and plan with patient/guardian, who agrees with care plan. All questions answered. Return precautions discussed and outpatient follow up given.   New Prescriptions   PREDNISONE (DELTASONE) 50 MG TABLET    Take 1 tablet daily with breakfast      I personally performed the services described in this documentation, which was scribed in my presence. The recorded information has been reviewed and is accurate.    Wynetta Emeryicole Larin Depaoli, PA-C 01/03/15 1728  Toy BakerAnthony T Allen, MD 01/04/15 [phone]

## 2015-01-13 ENCOUNTER — Emergency Department (HOSPITAL_COMMUNITY)
Admission: EM | Admit: 2015-01-13 | Discharge: 2015-01-13 | Disposition: A | Discharge status: Home / Self Care | Payer: No Typology Code available for payment source | Attending: Emergency Medicine | Admitting: Emergency Medicine

## 2015-01-13 ENCOUNTER — Encounter (HOSPITAL_COMMUNITY): Payer: Self-pay | Admitting: Emergency Medicine

## 2015-01-13 DIAGNOSIS — Z79899 Other long term (current) drug therapy: Secondary | ICD-10-CM | POA: Insufficient documentation

## 2015-01-13 DIAGNOSIS — Z791 Long term (current) use of non-steroidal anti-inflammatories (NSAID): Secondary | ICD-10-CM | POA: Insufficient documentation

## 2015-01-13 DIAGNOSIS — J45909 Unspecified asthma, uncomplicated: Secondary | ICD-10-CM | POA: Insufficient documentation

## 2015-01-13 DIAGNOSIS — Z720 Tobacco use: Secondary | ICD-10-CM | POA: Insufficient documentation

## 2015-01-13 DIAGNOSIS — E119 Type 2 diabetes mellitus without complications: Secondary | ICD-10-CM | POA: Insufficient documentation

## 2015-01-13 DIAGNOSIS — M79609 Pain in unspecified limb: Secondary | ICD-10-CM

## 2015-01-13 DIAGNOSIS — M79604 Pain in right leg: Secondary | ICD-10-CM | POA: Insufficient documentation

## 2015-01-13 MED ORDER — CYCLOBENZAPRINE HCL 10 MG PO TABS: 10.0000 mg | ORAL_TABLET | Freq: Two times a day (BID) | ORAL | Status: DC | PRN

## 2015-01-13 MED ORDER — IBUPROFEN 800 MG PO TABS: 800.0000 mg | ORAL_TABLET | Freq: Three times a day (TID) | ORAL | Status: DC

## 2015-01-13 MED ORDER — IBUPROFEN 800 MG PO TABS
800.0000 mg | ORAL_TABLET | Freq: Once | ORAL | Status: AC
  Administered 2015-01-13: 800 mg via ORAL
  Filled 2015-01-13: qty 1

## 2015-01-13 NOTE — ED Notes (Signed)
Pt reports driving for 7 hours today starting at 8am and started "feeling pain in my knee right knee at 1230". Pt denies injury. Pt said he did stop twice to walk around. Pt reports numbness in foot as well. Pt is ambulatory. Pulses present.

## 2015-01-13 NOTE — Progress Notes (Signed)
*  PRELIMINARY RESULTS* Vascular Ultrasound Right lower extremity venous duplex has been completed.  Preliminary findings: no evidence of DVT  Farrel DemarkJill Eunice, RDMS, RVT  01/13/2015, 4:50 PM

## 2015-01-13 NOTE — Discharge Instructions (Signed)
RICE: Routine Care for Injuries The routine care of many injuries includes Rest, Ice, Compression, and Elevation (RICE). HOME CARE INSTRUCTIONS  Rest is needed to allow your body to heal. Routine activities can usually be resumed when comfortable. Injured tendons and bones can take up to 6 weeks to heal. Tendons are the cord-like structures that attach muscle to bone.  Ice following an injury helps keep the swelling down and reduces pain.  Put ice in a plastic bag.  Place a towel between your skin and the bag.  Leave the ice on for 15-20 minutes, 3-4 times a day, or as directed by your health care provider. Do this while awake, for the first 24 to 48 hours. After that, continue as directed by your caregiver.  Compression helps keep swelling down. It also gives support and helps with discomfort. If an elastic bandage has been applied, it should be removed and reapplied every 3 to 4 hours. It should not be applied tightly, but firmly enough to keep swelling down. Watch fingers or toes for swelling, bluish discoloration, coldness, numbness, or excessive pain. If any of these problems occur, remove the bandage and reapply loosely. Contact your caregiver if these problems continue.  Elevation helps reduce swelling and decreases pain. With extremities, such as the arms, hands, legs, and feet, the injured area should be placed near or above the level of the heart, if possible. SEEK IMMEDIATE MEDICAL CARE IF:  You have persistent pain and swelling.  You develop redness, numbness, or unexpected weakness.  Your symptoms are getting worse rather than improving after several days. These symptoms may indicate that further evaluation or further X-rays are needed. Sometimes, X-rays may not show a small broken bone (fracture) until 1 week or 10 days later. Make a follow-up appointment with your caregiver. Ask when your X-ray results will be ready. Make sure you get your X-ray results. Document Released:  03/17/2001 Document Revised: 12/08/2013 Document Reviewed: 05/04/2011 [employer] Patient Information 2015 [employer]. This information is not intended to replace advice given to you by your health care provider. Make sure you discuss any questions you have with your health care provider.  

## 2015-01-13 NOTE — ED Notes (Signed)
Pt reports R knee pain-states that he has been driving for 7 hours.  Pain started while driving.  No swelling noted at this time.  Pt ambulatory without difficulty

## 2015-01-13 NOTE — ED Notes (Addendum)
US at bedside

## 2015-01-13 NOTE — ED Provider Notes (Signed)
CSN: [id number]     Arrival date & time 01/13/15  1538 History  This chart was scribed for non-physician practitioner Fayrene Helper, working with Gwyneth Sprout, MD by Carl Best, ED Scribe. This patient was seen in room WTR8/WTR8 and the patient's care was started at 4:11 PM.   No chief complaint on file.  The history is provided by the patient. No language interpreter was used.   HPI Comments: Cody Navarro is a 19 y.o. male with a history of DM who presents to the Emergency Department complaining of gradually worsening, tight, sharp, right knee pain radiating up to his right thigh that started this afternoon as he was driving to [place].  He states that he has been driving since 8 AM this morning and went to [facility] and [place] before coming to [place].  He states that he drives 2-3 hours on a daily basis.  He has not experienced these symptoms in the past.  He does not have a history of PE or DVT.  He has not had any surgeries recently.  He denies chest pain and difficulty breathing as associated symptoms.    Past Medical History  Diagnosis Date  . Asthma   . Diabetes mellitus without complication    History reviewed. No pertinent past surgical history. Family History  Problem Relation Age of Onset  . Diabetes Other    History  Substance Use Topics  . Smoking status: Current Every Day Smoker -- 0.01 packs/day for .2 years    Types: Cigarettes  . Smokeless tobacco: Never Used  . Alcohol Use: Yes    Review of Systems  Respiratory: Negative for shortness of breath.   Cardiovascular: Negative for chest pain.  Musculoskeletal: Positive for arthralgias.  All other systems reviewed and are negative.   Allergies  Banana; Coconut fatty acids; and Mushroom extract complex  Home Medications   Prior to Admission medications   Medication Sig Start Date End Date Taking? Authorizing Provider  bacitracin ointment Apply 1 application topically 2 (two) times daily. 05/22/14   Brandt Loosen, MD  cyclobenzaprine (FLEXERIL) 10 MG tablet Take 1 tablet (10 mg total) by mouth 2 (two) times daily as needed for muscle spasms. 12/06/14   Monte Fantasia, PA-C  ibuprofen (ADVIL,MOTRIN) 800 MG tablet Take 1 tablet (800 mg total) by mouth 3 (three) times daily. 12/06/14   Monte Fantasia, PA-C  predniSONE (DELTASONE) 50 MG tablet Take 1 tablet daily with breakfast 01/03/15   Joni Reining Pisciotta, PA-C  pseudoephedrine-guaifenesin [facility] D) 60-600 MG per tablet Take 1 tablet by mouth every 12 (twelve) hours.    Historical Provider, MD   Triage Vitals: BP 130/77 mmHg  Pulse 74  Temp(Src) 98.1 F (36.7 C) (Oral)  Resp 18  SpO2 100%  Physical Exam  Constitutional: He is oriented to person, place, and time. He appears well-developed and well-nourished.  HENT:  Head: Normocephalic and atraumatic.  Eyes: EOM are normal.  Neck: Normal range of motion.  Cardiovascular: Normal rate.   Pedal pulse intact.  Pulmonary/Chest: Effort normal.  Musculoskeletal: Normal range of motion.  Right lower extremities - No erythema.  No edema.  No palpable cord.    Neurological: He is alert and oriented to person, place, and time.  Skin: Skin is warm and dry.  Psychiatric: He has a normal mood and affect. His behavior is normal.  Nursing note and vitals reviewed.   ED Course  Procedures (including critical care time)  DIAGNOSTIC STUDIES: Oxygen Saturation is 100% on  room air, normal by my interpretation.    COORDINATION OF CARE: 4:17 PM- patient works for [employer], has to travel long distance on a regular basis, here for complaints of right knee and thigh pain. On examination, minimal tenderness to right knee and no significant evidence to suggest DVT on right leg. However given the the risk factor of prolonged sitting, Will conduct an US to rule out a clinical suspicion of DVT and the patient agreed to the treatment plan.  Ibuprofen given for pain, patient otherwise neurovascularly intact. He is able  to ambulate.   5:48 PM Author: Glendale ChardJill I Eunice Service: Vascular Lab Author Type: Cardiovascular Sonographer    Filed: 01/13/2015 4:50 PM Note Time: 01/13/2015 4:50 PM Status: Signed   Editor: Glendale ChardJill I Eunice (Cardiovascular Sonographer)     Expand All Collapse All   *PRELIMINARY RESULTS* Vascular Ultrasound Right lower extremity venous duplex has been completed. Preliminary findings: no evidence of DVT  Farrel DemarkJill Eunice, RDMS, RVT 01/13/2015, 4:50 PM     No DVT  Labs Review Labs Reviewed - No data to display  Imaging Review No results found.   EKG Interpretation None      MDM   Final diagnoses:  Right leg pain    BP 130/77 mmHg  Pulse 74  Temp(Src) 98.1 F (36.7 C) (Oral)  Resp 18  SpO2 100%   I personally performed the services described in this documentation, which was scribed in my presence. The recorded information has been reviewed and is accurate.     Fayrene HelperBowie Tuyet Bader, PA-C 01/13/15 1750  Gwyneth SproutWhitney Plunkett, MD 01/14/15 0005

## 2015-02-07 ENCOUNTER — Emergency Department (HOSPITAL_COMMUNITY)
Admission: EM | Admit: 2015-02-07 | Discharge: 2015-02-07 | Disposition: A | Discharge status: Home / Self Care | Payer: Medicaid Other | Attending: Emergency Medicine | Admitting: Emergency Medicine

## 2015-02-07 ENCOUNTER — Encounter (HOSPITAL_COMMUNITY): Payer: Self-pay

## 2015-02-07 DIAGNOSIS — E119 Type 2 diabetes mellitus without complications: Secondary | ICD-10-CM | POA: Insufficient documentation

## 2015-02-07 DIAGNOSIS — R21 Rash and other nonspecific skin eruption: Secondary | ICD-10-CM | POA: Insufficient documentation

## 2015-02-07 DIAGNOSIS — Z7952 Long term (current) use of systemic steroids: Secondary | ICD-10-CM | POA: Insufficient documentation

## 2015-02-07 DIAGNOSIS — J45909 Unspecified asthma, uncomplicated: Secondary | ICD-10-CM | POA: Insufficient documentation

## 2015-02-07 DIAGNOSIS — Z79899 Other long term (current) drug therapy: Secondary | ICD-10-CM | POA: Insufficient documentation

## 2015-02-07 DIAGNOSIS — Z720 Tobacco use: Secondary | ICD-10-CM | POA: Insufficient documentation

## 2015-02-07 MED ORDER — DIPHENHYDRAMINE HCL 25 MG PO CAPS
25.0000 mg | ORAL_CAPSULE | Freq: Once | ORAL | Status: AC
  Administered 2015-02-07: 25 mg via ORAL
  Filled 2015-02-07: qty 1

## 2015-02-07 MED ORDER — DIPHENHYDRAMINE HCL 25 MG PO CAPS: 25.0000 mg | ORAL_CAPSULE | Freq: Four times a day (QID) | ORAL | Status: DC | PRN

## 2015-02-07 MED ORDER — DESONIDE 0.05 % EX CREA: TOPICAL_CREAM | Freq: Two times a day (BID) | CUTANEOUS | Status: DC

## 2015-02-07 NOTE — ED Provider Notes (Signed)
CSN: [id number]     Arrival date & time 02/07/15  0024 History   First MD Initiated Contact with Patient 02/07/15 0125     Chief Complaint  Patient presents with  . Rash     (Consider location/radiation/quality/duration/timing/severity/associated sxs/prior Treatment) HPI Comments: Pt comes in with skin rash. Pt has hx of eczema. Reports that he started having rash a month ago. He has dry, itchy rash over his hands, which overtime spread to his thighs, and chest, abd and back. There is no recent hx of eczema flareup. Pt has no known allergies or chemical irritant exposures. Pt is not taking any meds right now.  Patient is a 19 y.o. male presenting with rash. The history is provided by the patient.  Rash Associated symptoms: no joint pain, no myalgias and not wheezing     Past Medical History  Diagnosis Date  . Asthma   . Diabetes mellitus without complication    History reviewed. No pertinent past surgical history. Family History  Problem Relation Age of Onset  . Diabetes Other    History  Substance Use Topics  . Smoking status: Current Every Day Smoker -- 0.01 packs/day for .2 years    Types: Cigarettes  . Smokeless tobacco: Never Used  . Alcohol Use: Yes    Review of Systems  Respiratory: Negative for wheezing.   Gastrointestinal: Negative for blood in stool.  Musculoskeletal: Negative for myalgias and arthralgias.  Skin: Positive for rash.  Allergic/Immunologic: Negative for immunocompromised state.      Allergies  Banana; Coconut fatty acids; and Mushroom extract complex  Home Medications   Prior to Admission medications   Medication Sig Start Date End Date Taking? Authorizing Provider  bacitracin ointment Apply 1 application topically 2 (two) times daily. Patient not taking: Reported on 02/07/2015 05/22/14   Brandt LoosenJulie Manly, MD  cyclobenzaprine (FLEXERIL) 10 MG tablet Take 1 tablet (10 mg total) by mouth 2 (two) times daily as needed for muscle spasms. Patient not  taking: Reported on 02/07/2015 01/13/15   Fayrene HelperBowie Tran, PA-C  desonide (DESOWEN) 0.05 % cream Apply topically 2 (two) times daily. 02/07/15   Derwood KaplanAnkit Chason Mciver, MD  diphenhydrAMINE (BENADRYL) 25 mg capsule Take 1 capsule (25 mg total) by mouth every 6 (six) hours as needed for itching. 02/07/15   Derwood KaplanAnkit Makaio Mach, MD  ibuprofen (ADVIL,MOTRIN) 800 MG tablet Take 1 tablet (800 mg total) by mouth 3 (three) times daily. Patient not taking: Reported on 02/07/2015 01/13/15   Fayrene HelperBowie Tran, PA-C  predniSONE (DELTASONE) 50 MG tablet Take 1 tablet daily with breakfast Patient not taking: Reported on 02/07/2015 01/03/15   Joni ReiningNicole Pisciotta, PA-C  pseudoephedrine-guaifenesin [facility](MUCINEX D) 60-600 MG per tablet Take 1 tablet by mouth every 12 (twelve) hours.    Historical Provider, MD   BP 137/70 mmHg  Pulse 81  Temp(Src) 97.4 F (36.3 C) (Oral)  Resp 18  SpO2 100% Physical Exam  Constitutional: He is oriented to person, place, and time. He appears well-developed.  HENT:  Head: Normocephalic and atraumatic.  Eyes: Conjunctivae and EOM are normal.  Neck: Neck supple.  Cardiovascular: Normal rate and regular rhythm.   Pulmonary/Chest: Effort normal and breath sounds normal.  Abdominal: Soft. Bowel sounds are normal. He exhibits no distension. There is no tenderness.  Neurological: He is alert and oriented to person, place, and time.  Skin: Skin is warm. Rash noted.  + erythematous appearing skin, with some dryness. No scaling. Lesion over the inner thigh, hands, abdomen noticed.  Nursing note and  vitals reviewed.   ED Course  Procedures (including critical care time) Labs Review Labs Reviewed - No data to display  Imaging Review No results found.   EKG Interpretation None      MDM   Final diagnoses:  Skin rash    Pt with a + skin rash. Suspected eczema, but could be any other cause for dermatitis. Will start with topical low potency steroid, and give him [facility] f/u. No personal or famly hx of  autoimmune conditions.  Derwood Kaplan, MD 02/07/15 [phone]

## 2015-02-07 NOTE — Discharge Instructions (Signed)
Please take the meds prescirbed. Use over the counter vaseline or aquaphor or eucerin cream as well.  See the [facility] doctors if not better.  Eczema Eczema, also called atopic dermatitis, is a skin disorder that causes inflammation of the skin. It causes a red rash and dry, scaly skin. The skin becomes very itchy. Eczema is generally worse during the cooler winter months and often improves with the warmth of summer. Eczema usually starts showing signs in infancy. Some children outgrow eczema, but it may last through adulthood.  CAUSES  The exact cause of eczema is not known, but it appears to run in families. People with eczema often have a family history of eczema, allergies, asthma, or hay fever. Eczema is not contagious. Flare-ups of the condition may be caused by:   Contact with something you are sensitive or allergic to.   Stress. SIGNS AND SYMPTOMS  Dry, scaly skin.   Red, itchy rash.   Itchiness. This may occur before the skin rash and may be very intense.  DIAGNOSIS  The diagnosis of eczema is usually made based on symptoms and medical history. TREATMENT  Eczema cannot be cured, but symptoms usually can be controlled with treatment and other strategies. A treatment plan might include:  Controlling the itching and scratching.   Use over-the-counter antihistamines as directed for itching. This is especially useful at night when the itching tends to be worse.   Use over-the-counter steroid creams as directed for itching.   Avoid scratching. Scratching makes the rash and itching worse. It may also result in a skin infection (impetigo) due to a break in the skin caused by scratching.   Keeping the skin well moisturized with creams every day. This will seal in moisture and help prevent dryness. Lotions that contain alcohol and water should be avoided because they can dry the skin.   Limiting exposure to things that you are sensitive or allergic to (allergens).    Recognizing situations that cause stress.   Developing a plan to manage stress.  HOME CARE INSTRUCTIONS   Only take over-the-counter or prescription medicines as directed by your health care provider.   Do not use anything on the skin without checking with your health care provider.   Keep baths or showers short (5 minutes) in warm (not hot) water. Use mild cleansers for bathing. These should be unscented. You may add nonperfumed bath oil to the bath water. It is best to avoid soap and bubble bath.   Immediately after a bath or shower, when the skin is still damp, apply a moisturizing ointment to the entire body. This ointment should be a petroleum ointment. This will seal in moisture and help prevent dryness. The thicker the ointment, the better. These should be unscented.   Keep fingernails cut short. Children with eczema may need to wear soft gloves or mittens at night after applying an ointment.   Dress in clothes made of cotton or cotton blends. Dress lightly, because heat increases itching.   A child with eczema should stay away from anyone with fever blisters or cold sores. The virus that causes fever blisters (herpes simplex) can cause a serious skin infection in children with eczema. SEEK MEDICAL CARE IF:   Your itching interferes with sleep.   Your rash gets worse or is not better within 1 week after starting treatment.   You see pus or soft yellow scabs in the rash area.   You have a fever.   You have a rash  flare-up after contact with someone who has fever blisters.  Document Released: 11/30/2000 Document Revised: 09/23/2013 Document Reviewed: 07/06/2013 [facility] Patient Information 2015 [place], [place]. This information is not intended to replace advice given to you by your health care provider. Make sure you discuss any questions you have with your health care provider.

## 2015-02-07 NOTE — ED Notes (Signed)
Pt presents with c/o rash for approx one month. Seen for same previously, reports medication is not working.

## 2015-03-17 ENCOUNTER — Encounter (HOSPITAL_COMMUNITY): Payer: Self-pay | Admitting: Emergency Medicine

## 2015-03-17 ENCOUNTER — Emergency Department (HOSPITAL_COMMUNITY): Payer: Medicaid Other

## 2015-03-17 ENCOUNTER — Emergency Department (HOSPITAL_COMMUNITY)
Admission: EM | Admit: 2015-03-17 | Discharge: 2015-03-17 | Disposition: A | Discharge status: Home / Self Care | Payer: Medicaid Other | Attending: Emergency Medicine | Admitting: Emergency Medicine

## 2015-03-17 DIAGNOSIS — S060X0D Concussion without loss of consciousness, subsequent encounter: Secondary | ICD-10-CM

## 2015-03-17 DIAGNOSIS — R21 Rash and other nonspecific skin eruption: Secondary | ICD-10-CM | POA: Insufficient documentation

## 2015-03-17 DIAGNOSIS — B86 Scabies: Secondary | ICD-10-CM

## 2015-03-17 DIAGNOSIS — E119 Type 2 diabetes mellitus without complications: Secondary | ICD-10-CM | POA: Insufficient documentation

## 2015-03-17 DIAGNOSIS — R519 Headache, unspecified: Secondary | ICD-10-CM

## 2015-03-17 DIAGNOSIS — F419 Anxiety disorder, unspecified: Secondary | ICD-10-CM | POA: Insufficient documentation

## 2015-03-17 DIAGNOSIS — Z720 Tobacco use: Secondary | ICD-10-CM | POA: Insufficient documentation

## 2015-03-17 DIAGNOSIS — R51 Headache: Secondary | ICD-10-CM

## 2015-03-17 DIAGNOSIS — W2203XD Walked into furniture, subsequent encounter: Secondary | ICD-10-CM | POA: Insufficient documentation

## 2015-03-17 MED ORDER — PERMETHRIN 5 % EX CREA: TOPICAL_CREAM | CUTANEOUS | Status: DC
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
## 2015-03-17 NOTE — Discharge Instructions (Signed)
Scabies Scabies are small bugs (mites) that burrow under the skin and cause red bumps and severe itching. These bugs can only be seen with a microscope. Scabies are highly contagious. They can spread easily from person to person by direct contact. They are also spread through sharing clothing or linens that have the scabies mites living in them. It is not unusual for an entire family to become infected through shared towels, clothing, or bedding.  HOME CARE INSTRUCTIONS   Your caregiver may prescribe a cream or lotion to kill the mites. If cream is prescribed, massage the cream into the entire body from the neck to the bottom of both feet. Also massage the cream into the scalp and face if your child is less than 56 year old. Avoid the eyes and mouth. Do not wash your hands after application.  Leave the cream on for 8 to 12 hours. Your child should bathe or shower after the 8 to 12 hour application period. Sometimes it is helpful to apply the cream to your child right before bedtime.  One treatment is usually effective and will eliminate approximately 95% of infestations. For severe cases, your caregiver may decide to repeat the treatment in 1 week. Everyone in your household should be treated with one application of the cream.  New rashes or burrows should not appear within 24 to 48 hours after successful treatment. However, the itching and rash may last for 2 to 4 weeks after successful treatment. Your caregiver may prescribe a medicine to help with the itching or to help the rash go away more quickly.  Scabies can live on clothing or linens for up to 3 days. All of your child's recently used clothing, towels, stuffed toys, and bed linens should be washed in hot water and then dried in a dryer for at least 20 minutes on high heat. Items that cannot be washed should be enclosed in a plastic bag for at least 3 days.  To help relieve itching, bathe your child in a cool bath or apply cool washcloths to the  affected areas.  Your child may return to school after treatment with the prescribed cream. SEEK MEDICAL CARE IF:   The itching persists longer than 4 weeks after treatment.  The rash spreads or becomes infected. Signs of infection include red blisters or yellow-tan crust. Document Released: 12/03/2005 Document Revised: 02/25/2012 Document Reviewed: 04/13/2009 [facility] Patient Information 2015 [place], [place]. This information is not intended to replace advice given to you by your health care provider. Make sure you discuss any questions you have with your health care provider. Concussion A concussion, or closed-head injury, is a brain injury caused by a direct blow to the head or by a quick and sudden movement (jolt) of the head or neck. Concussions are usually not life-threatening. Even so, the effects of a concussion can be serious. If you have had a concussion before, you are more likely to experience concussion-like symptoms after a direct blow to the head.  CAUSES  Direct blow to the head, such as from running into another player during a soccer game, being hit in a fight, or hitting your head on a hard surface.  A jolt of the head or neck that causes the brain to move back and forth inside the skull, such as in a car crash. SIGNS AND SYMPTOMS The signs of a concussion can be hard to notice. Early on, they may be missed by you, family members, and health care providers. You may  look fine but act or feel differently. Symptoms are usually temporary, but they may last for days, weeks, or even longer. Some symptoms may appear right away while others may not show up for hours or days. Every head injury is different. Symptoms include:  Mild to moderate headaches that will not go away.  A feeling of pressure inside your head.  Having more trouble than usual:  Learning or remembering things you have heard.  Answering questions.  Paying attention or concentrating.  Organizing daily  tasks.  Making decisions and solving problems.  Slowness in thinking, acting or reacting, speaking, or reading.  Getting lost or being easily confused.  Feeling tired all the time or lacking energy (fatigued).  Feeling drowsy.  Sleep disturbances.  Sleeping more than usual.  Sleeping less than usual.  Trouble falling asleep.  Trouble sleeping (insomnia).  Loss of balance or feeling lightheaded or dizzy.  Nausea or vomiting.  Numbness or tingling.  Increased sensitivity to:  Sounds.  Lights.  Distractions.  Vision problems or eyes that tire easily.  Diminished sense of taste or smell.  Ringing in the ears.  Mood changes such as feeling sad or anxious.  Becoming easily irritated or angry for little or no reason.  Lack of motivation.  Seeing or hearing things other people do not see or hear (hallucinations). DIAGNOSIS Your health care provider can usually diagnose a concussion based on a description of your injury and symptoms. He or she will ask whether you passed out (lost consciousness) and whether you are having trouble remembering events that happened right before and during your injury. Your evaluation might include:  A brain scan to look for signs of injury to the brain. Even if the test shows no injury, you may still have a concussion.  Blood tests to be sure other problems are not present. TREATMENT  Concussions are usually treated in an emergency department, in urgent care, or at a clinic. You may need to stay in the hospital overnight for further treatment.  Tell your health care provider if you are taking any medicines, including prescription medicines, over-the-counter medicines, and natural remedies. Some medicines, such as blood thinners (anticoagulants) and aspirin, may increase the chance of complications. Also tell your health care provider whether you have had alcohol or are taking illegal drugs. This information may affect  treatment.  Your health care provider will send you home with important instructions to follow.  How fast you will recover from a concussion depends on many factors. These factors include how severe your concussion is, what part of your brain was injured, your age, and how healthy you were before the concussion.  Most people with mild injuries recover fully. Recovery can take time. In general, recovery is slower in older persons. Also, persons who have had a concussion in the past or have other medical problems may find that it takes longer to recover from their current injury. HOME CARE INSTRUCTIONS General Instructions  Carefully follow the directions your health care provider gave you.  Only take over-the-counter or prescription medicines for pain, discomfort, or fever as directed by your health care provider.  Take only those medicines that your health care provider has approved.  Do not drink alcohol until your health care provider says you are well enough to do so. Alcohol and certain other drugs may slow your recovery and can put you at risk of further injury.  If it is harder than usual to remember things, write them down.  If  you are easily distracted, try to do one thing at a time. For example, do not try to watch TV while fixing dinner.  Talk with family members or close friends when making important decisions.  Keep all follow-up appointments. Repeated evaluation of your symptoms is recommended for your recovery.  Watch your symptoms and tell others to do the same. Complications sometimes occur after a concussion. Older adults with a brain injury may have a higher risk of serious complications, such as a blood clot on the brain.  Tell your teachers, school nurse, school counselor, coach, athletic trainer, or work Production designer, theatre/television/film about your injury, symptoms, and restrictions. Tell them about what you can or cannot do. They should watch for:  Increased problems with attention or  concentration.  Increased difficulty remembering or learning new information.  Increased time needed to complete tasks or assignments.  Increased irritability or decreased ability to cope with stress.  Increased symptoms.  Rest. Rest helps the brain to heal. Make sure you:  Get plenty of sleep at night. Avoid staying up late at night.  Keep the same bedtime hours on weekends and weekdays.  Rest during the day. Take daytime naps or rest breaks when you feel tired.  Limit activities that require a lot of thought or concentration. These include:  Doing homework or job-related work.  Watching TV.  Working on the computer.  Avoid any situation where there is potential for another head injury (football, hockey, soccer, basketball, martial arts, downhill snow sports and horseback riding). Your condition will get worse every time you experience a concussion. You should avoid these activities until you are evaluated by the appropriate follow-up health care providers. Returning To Your Regular Activities You will need to return to your normal activities slowly, not all at once. You must give your body and brain enough time for recovery.  Do not return to sports or other athletic activities until your health care provider tells you it is safe to do so.  Ask your health care provider when you can drive, ride a bicycle, or operate heavy machinery. Your ability to react may be slower after a brain injury. Never do these activities if you are dizzy.  Ask your health care provider about when you can return to work or school. Preventing Another Concussion It is very important to avoid another brain injury, especially before you have recovered. In rare cases, another injury can lead to permanent brain damage, brain swelling, or death. The risk of this is greatest during the first 7-10 days after a head injury. Avoid injuries by:  Wearing a seat belt when riding in a car.  Drinking alcohol only  in moderation.  Wearing a helmet when biking, skiing, skateboarding, skating, or doing similar activities.  Avoiding activities that could lead to a second concussion, such as contact or recreational sports, until your health care provider says it is okay.  Taking safety measures in your home.  Remove clutter and tripping hazards from floors and stairways.  Use grab bars in bathrooms and handrails by stairs.  Place non-slip mats on floors and in bathtubs.  Improve lighting in dim areas. SEEK MEDICAL CARE IF:  You have increased problems paying attention or concentrating.  You have increased difficulty remembering or learning new information.  You need more time to complete tasks or assignments than before.  You have increased irritability or decreased ability to cope with stress.  You have more symptoms than before. Seek medical care if you have any of  the following symptoms for more than 2 weeks after your injury:  Lasting (chronic) headaches.  Dizziness or balance problems.  Nausea.  Vision problems.  Increased sensitivity to noise or light.  Depression or mood swings.  Anxiety or irritability.  Memory problems.  Difficulty concentrating or paying attention.  Sleep problems.  Feeling tired all the time. SEEK IMMEDIATE MEDICAL CARE IF:  You have severe or worsening headaches. These may be a sign of a blood clot in the brain.  You have weakness (even if only in one hand, leg, or part of the face).  You have numbness.  You have decreased coordination.  You vomit repeatedly.  You have increased sleepiness.  One pupil is larger than the other.  You have convulsions.  You have slurred speech.  You have increased confusion. This may be a sign of a blood clot in the brain.  You have increased restlessness, agitation, or irritability.  You are unable to recognize people or places.  You have neck pain.  It is difficult to wake you up.  You have  unusual behavior changes.  You lose consciousness. MAKE SURE YOU:  Understand these instructions.  Will watch your condition.  Will get help right away if you are not doing well or get worse. Document Released: 02/23/2004 Document Revised: 12/08/2013 Document Reviewed: 06/25/2013 [facility] Patient Information 2015 [place]. This information is not intended to replace advice given to you by your health care provider. Make sure you discuss any questions you have with your health care provider.

## 2016-04-22 ENCOUNTER — Emergency Department (HOSPITAL_COMMUNITY): Payer: Self-pay

## 2016-04-22 ENCOUNTER — Emergency Department (HOSPITAL_COMMUNITY)
Admission: EM | Admit: 2016-04-22 | Discharge: 2016-04-22 | Disposition: A | Discharge status: Home / Self Care | Payer: Self-pay | Attending: Emergency Medicine | Admitting: Emergency Medicine

## 2016-04-22 ENCOUNTER — Encounter (HOSPITAL_COMMUNITY): Payer: Self-pay

## 2016-04-22 DIAGNOSIS — R0789 Other chest pain: Secondary | ICD-10-CM | POA: Insufficient documentation

## 2016-04-22 DIAGNOSIS — Z791 Long term (current) use of non-steroidal anti-inflammatories (NSAID): Secondary | ICD-10-CM | POA: Insufficient documentation

## 2016-04-22 DIAGNOSIS — Z7952 Long term (current) use of systemic steroids: Secondary | ICD-10-CM | POA: Insufficient documentation

## 2016-04-22 DIAGNOSIS — E119 Type 2 diabetes mellitus without complications: Secondary | ICD-10-CM | POA: Insufficient documentation

## 2016-04-22 DIAGNOSIS — Z79899 Other long term (current) drug therapy: Secondary | ICD-10-CM | POA: Insufficient documentation

## 2016-04-22 DIAGNOSIS — F17210 Nicotine dependence, cigarettes, uncomplicated: Secondary | ICD-10-CM | POA: Insufficient documentation

## 2016-04-22 DIAGNOSIS — J45909 Unspecified asthma, uncomplicated: Secondary | ICD-10-CM | POA: Insufficient documentation

## 2016-04-22 LAB — BASIC METABOLIC PANEL
Anion gap: 10 (ref 5–15)
BUN: 13 mg/dL (ref 6–20)
CHLORIDE: 108 mmol/L (ref 101–111)
CO2: 23 mmol/L (ref 22–32)
Calcium: 9 mg/dL (ref 8.9–10.3)
Creatinine, Ser: 0.86 mg/dL (ref 0.61–1.24)
GFR calc Af Amer: >60 mL/min (ref >60)
GFR calc non Af Amer: >60 mL/min (ref >60)
GLUCOSE: 96 mg/dL (ref 65–99)
POTASSIUM: 3.8 mmol/L (ref 3.5–5.1)
Sodium: 141 mmol/L (ref 135–145)

## 2016-04-22 LAB — CBC
HCT: 41 % (ref 39.0–52.0)
Hemoglobin: 14.3 g/dL (ref 13.0–17.0)
MCH: 31.2 pg (ref 26.0–34.0)
MCHC: 34.9 g/dL (ref 30.0–36.0)
MCV: 89.3 fL (ref 78.0–100.0)
Platelets: 230 10*3/uL (ref 150–400)
RBC: 4.59 MIL/uL (ref 4.22–5.81)
RDW: 11.3 % — AB (ref 11.5–15.5)
WBC: 6 10*3/uL (ref 4.0–10.5)

## 2016-04-22 LAB — I-STAT TROPONIN, ED: Troponin i, poc: 0 ng/mL (ref 0.00–0.08)

## 2016-04-22 MED ORDER — KETOROLAC TROMETHAMINE 30 MG/ML IJ SOLN
30.0000 mg | Freq: Once | INTRAMUSCULAR | Status: AC
  Administered 2016-04-22: 30 mg via INTRAMUSCULAR
  Filled 2016-04-22: qty 1

## 2016-04-22 MED ORDER — GI COCKTAIL ~~LOC~~
30.0000 mL | Freq: Once | ORAL | Status: AC
  Administered 2016-04-22: 30 mL via ORAL
  Filled 2016-04-22: qty 30

## 2016-04-22 MED ORDER — NAPROXEN 500 MG PO TABS: 500.0000 mg | ORAL_TABLET | Freq: Two times a day (BID) | ORAL | Status: DC | PRN

## 2016-04-22 NOTE — ED Notes (Signed)
Pt ambulated to XR without difficulty.

## 2016-04-22 NOTE — ED Notes (Signed)
Patient states that had chest pain that began yesterday around 2:30 in the afternoon, patient states that was only sitting when the pain occurred.  The chest pain lasted about 5 minutes.  Patient states that pain returned this morning around 3:00 am.  Patient describes the pain as stabbing with associated SOB.  Patient denies nausea, denies diaphoresis, denies pain radiating.  Patient rates current pain 4/10.  Breathing even and unlabored.  NAD at this time.

## 2016-04-22 NOTE — ED Provider Notes (Signed)
CSN: [id number]     Arrival date & time 04/22/16  0501 History   First MD Initiated Contact with Patient 04/22/16 [phone]     Chief Complaint  Patient presents with  . Chest Pain     (Consider location/radiation/quality/duration/timing/severity/associated sxs/prior Treatment) HPI Comments: Cody Navarro is a 20 y.o. male with a PMHx of asthma, who presents to the ED with complaints of gradual onset chest pain that began yesterday around 2:30 PM, last 5 minutes, and then returned around 3 PM and has been intermittent since then. Chest pain started while at rest. He describes the pain as 7/10 left-sided sharp intermittent nonradiating pain worse with breathing and with no treatments tried prior to arrival. He states the pain is currently resolved. He states initially the pain felt like "it took his breath away" but denies feeling short of breath. He reports that he smokes approximately 1 cigarette per month, if that.  He denies any fevers, chills, cough, wheezing, shortness of breath, leg swelling, recent travel/surgery/immobilization, family or personal history of DVT/PE, claudication, orthopnea, abdominal pain, nausea, vomiting, diarrhea, constipation, dysuria, hematuria, numbness, tingling, weakness, diaphoresis, or lightheadedness. No family history of cardiac disease.  Of note, when asked about his PMHx of DM2, he says that "it's because in college I was eating a lot of ramen noodles and there's a lot of sodium, so it started because of that, but I don't take anything for it because as long as I take care of myself I'm fine". Chart review reveals no lab findings consistent with diabetes diagnosis. HgbA1C in 06/2013 was 4.6. No glucose level on file has ever been >96.  Patient is a 20 y.o. male presenting with chest pain. The history is provided by the patient and medical records. No language interpreter was used.  Chest Pain Pain location:  L chest Pain quality: sharp   Pain radiates to:  Does not  radiate Pain radiates to the back: no   Pain severity:  Moderate Onset quality:  Gradual Duration:  15 hours Timing:  Intermittent Progression:  Partially resolved Chronicity:  New Context: at rest   Relieved by:  None tried Worsened by:  Deep breathing Ineffective treatments:  None tried Associated symptoms: no abdominal pain, no claudication, no cough, no diaphoresis, no fever, no lower extremity edema, no nausea, no numbness, no orthopnea, no shortness of breath, not vomiting and no weakness   Risk factors: male sex and smoking (very rarely)   Risk factors: no coronary artery disease, no diabetes mellitus, no high cholesterol, no hypertension, no immobilization, no prior DVT/PE and no surgery     Past Medical History  Diagnosis Date  . Asthma   . Diabetes mellitus without complication (HCC)    History reviewed. No pertinent past surgical history. Family History  Problem Relation Age of Onset  . Diabetes Other    Social History  Substance Use Topics  . Smoking status: Current Every Day Smoker -- 0.01 packs/day for .2 years    Types: Cigarettes  . Smokeless tobacco: Never Used  . Alcohol Use: Yes    Review of Systems  Constitutional: Negative for fever, chills and diaphoresis.  Respiratory: Negative for cough, shortness of breath and wheezing.   Cardiovascular: Positive for chest pain. Negative for orthopnea, claudication and leg swelling.  Gastrointestinal: Negative for nausea, vomiting, abdominal pain, diarrhea and constipation.  Genitourinary: Negative for dysuria and hematuria.  Musculoskeletal: Negative for myalgias and arthralgias.  Skin: Negative for color change.  Allergic/Immunologic: Negative for immunocompromised state.  Neurological: Negative for weakness, light-headedness and numbness.  Psychiatric/Behavioral: Negative for confusion.   10 Systems reviewed and are negative for acute change except as noted in the HPI.    Allergies  Banana; Coconut fatty  acids; and Mushroom extract complex  Home Medications   Prior to Admission medications   Medication Sig Start Date End Date Taking? Authorizing Provider  ibuprofen (ADVIL,MOTRIN) 200 MG tablet Take 400 mg by mouth every 6 (six) hours as needed for moderate pain.   Yes Historical Provider, MD  bacitracin ointment Apply 1 application topically 2 (two) times daily. Patient not taking: Reported on 02/07/2015 05/22/14   Brandt Loosen, MD  cyclobenzaprine (FLEXERIL) 10 MG tablet Take 1 tablet (10 mg total) by mouth 2 (two) times daily as needed for muscle spasms. Patient not taking: Reported on 02/07/2015 01/13/15   Fayrene Helper, PA-C  desonide (DESOWEN) 0.05 % cream Apply topically 2 (two) times daily. Patient not taking: Reported on 03/17/2015 02/07/15   Derwood Kaplan, MD  diphenhydrAMINE (BENADRYL) 25 mg capsule Take 1 capsule (25 mg total) by mouth every 6 (six) hours as needed for itching. Patient not taking: Reported on 04/22/2016 02/07/15   Derwood Kaplan, MD  ibuprofen (ADVIL,MOTRIN) 800 MG tablet Take 1 tablet (800 mg total) by mouth 3 (three) times daily. Patient not taking: Reported on 02/07/2015 01/13/15   Fayrene Helper, PA-C  permethrin (ELIMITE) 5 % cream Apply to entire body other than face - let sit for 14 hours then wash off, may repeat in 1 week if still having symptoms Patient not taking: Reported on 04/22/2016 03/17/15   Roxy Horseman, PA-C  predniSONE (DELTASONE) 50 MG tablet Take 1 tablet daily with breakfast Patient not taking: Reported on 02/07/2015 01/03/15   Joni Reining Pisciotta, PA-C   BP 121/85 mmHg  Pulse 86  Temp(Src) 98.2 F (36.8 C) (Oral)  Resp 18  Ht 6' (1.829 m)  Wt 81.647 kg  BMI 24.41 kg/m2  SpO2 97% Physical Exam  Constitutional: He is oriented to person, place, and time. Vital signs are normal. He appears well-developed and well-nourished.  Non-toxic appearance. No distress.  Afebrile, nontoxic, NAD Calmly sitting in bed, watching a movie on his laptop  HENT:  Head:  Normocephalic and atraumatic.  Mouth/Throat: Oropharynx is clear and moist and mucous membranes are normal.  Eyes: Conjunctivae and EOM are normal. Right eye exhibits no discharge. Left eye exhibits no discharge.  Neck: Normal range of motion. Neck supple.  Cardiovascular: Normal rate, regular rhythm, normal heart sounds and intact distal pulses.  Exam reveals no gallop and no friction rub.   No murmur heard. RRR, nl s1/s2, no m/r/g, distal pulses intact, no pedal edema   Pulmonary/Chest: Effort normal and breath sounds normal. No respiratory distress. He has no decreased breath sounds. He has no wheezes. He has no rhonchi. He has no rales. He exhibits no tenderness, no crepitus, no deformity and no retraction.  CTAB in all lung fields, no w/r/r, no hypoxia or increased WOB, speaking in full sentences, SpO2 97% on RA  Chest wall nonTTP without crepitus, deformities, or retractions   Abdominal: Soft. Normal appearance and bowel sounds are normal. He exhibits no distension. There is no tenderness. There is no rigidity, no rebound, no guarding, no CVA tenderness, no tenderness at McBurney's point and negative Murphy's sign.  Musculoskeletal: Normal range of motion.  MAE x4 Strength and sensation grossly intact Distal pulses intact Gait steady No pedal edema, neg homan's bilaterally   Neurological: He is alert  and oriented to person, place, and time. He has normal strength. No sensory deficit.  Skin: Skin is warm, dry and intact. No rash noted.  Psychiatric: He has a normal mood and affect.  Nursing note and vitals reviewed.   ED Course  Procedures (including critical care time) Labs Review Labs Reviewed  CBC - Abnormal; Notable for the following:    RDW 11.3 (*)    All other components within normal limits  BASIC METABOLIC PANEL  I-STAT TROPOININ, ED    Imaging Review Dg Chest 2 View  04/22/2016  CLINICAL DATA:  Left-sided chest pain and shortness of breath since this afternoon.  History of asthma and diabetes. EXAM: CHEST  2 VIEW COMPARISON:  10/18/2014 FINDINGS: The heart size and mediastinal contours are within normal limits. Both lungs are clear. The visualized skeletal structures are unremarkable. IMPRESSION: No active cardiopulmonary disease. Electronically Signed   By: Burman Nieves M.D.   On: 04/22/2016 05:35   I have personally reviewed and evaluated these images and lab results as part of my medical decision-making.   EKG Interpretation   Date/Time:  Sunday Apr 22 2016 05:08:53 EDT Ventricular Rate:  81 PR Interval:  171 QRS Duration: 95 QT Interval:  356 QTC Calculation: 413 R Axis:   43 Text Interpretation:  Sinus rhythm ST elev, probable normal early repol  pattern ecg similar to 02/2013 Reconfirmed by FLOYD MD, Reuel Boom [phone]) on  04/22/2016 5:16:40 AM Also confirmed by Adela Lank MD, DANIEL [phone]), editor  Stout CT, Jola Babinski [phone])  on 04/22/2016 7:40:08 AM      MDM   Final diagnoses:  Atypical chest pain    20 y.o. male here with CP x1 day. States it's intermittent. Initially "felt like the pain took my breath away" but denies feeling SOB. Currently asymptomatic. On exam, no tachycardia or hypoxia, no LE swelling, no RFs for PE, PERC neg therefore doubt PE. No reproducible pain on exam, but pt is asymptomatic from his pain at the time of exam. Calmly watching a movie on his laptop. Trop neg at 15hr mark, therefore doubt ACS. Low-risk HEART score, doubt need for ACS r/o admission or further testing. BMP pending, CBC WNL. CXR neg. EKG unchanged from prior, no acute ischemic findings. Will try toradol and GI cocktail, then reassess shortly.  8:16 AM Pt feeling improved. BMP unremarkable. Overall, CP atypical, likely MsK pain vs gas pain, doubt acute emergent condition requiring further testing/observation. Will start on naprosyn, and discussed use of heat. F/up with PCP in 1wk for recheck of symptoms. I explained the diagnosis and have given explicit  precautions to return to the ER including for any other new or worsening symptoms. The patient understands and accepts the medical plan as it's been dictated and I have answered their questions. Discharge instructions concerning home care and prescriptions have been given. The patient is STABLE and is discharged to home in good condition.  BP 121/85 mmHg  Pulse 86  Temp(Src) 98.2 F (36.8 C) (Oral)  Resp 18  Ht 6' (1.829 m)  Wt 81.647 kg  BMI 24.41 kg/m2  SpO2 97%  Meds ordered this encounter  Medications  . ketorolac (TORADOL) 30 MG/ML injection 30 mg    Sig:   . gi cocktail (Maalox,Lidocaine,Donnatal)    Sig:   . naproxen (NAPROSYN) 500 MG tablet    Sig: Take 1 tablet (500 mg total) by mouth 2 (two) times daily as needed for mild pain or moderate pain (TAKE WITH MEALS.).  Dispense:  20 tablet    Refill:  0    Order Specific Question:  Supervising Provider    Answer:  Eber HongMILLER, BRIAN [[id number]]     Lasharn Bufkin Camprubi-Soms, PA-C 04/22/16 0817  Melene Planan Floyd, DO 04/22/16 2301

## 2016-04-22 NOTE — Discharge Instructions (Signed)
Your labs, xray, and EKG are all reassuring, your chest pain could be a variety of things including gas pain, muscle pain, and other nonemergent issues. Alternate between tylenol and naprosyn as directed as needed for pain. Stay well hydrated. You may consider using heat to the areas of pain, no more than 20 minutes every hour. Follow up with your regular doctor in 1 week for recheck of symptoms. Return to the ER for changes or worsening symptoms.  SEEK IMMEDIATE MEDICAL ATTENTION IF: You develop a fever.  Your chest pains become severe or intolerable.  You develop new, unexplained symptoms (problems).  You develop shortness of breath, nausea, vomiting, sweating or feel light headed.  You develop a new cough or you cough up blood. You develop new leg swelling   Nonspecific Chest Pain It is often hard to find the cause of chest pain. There is always a chance that your pain could be related to something serious, such as a heart attack or a blood clot in your lungs. Chest pain can also be caused by conditions that are not life-threatening. If you have chest pain, it is very important to follow up with your doctor.  HOME CARE  If you were prescribed an antibiotic medicine, finish it all even if you start to feel better.  Avoid any activities that cause chest pain.  Do not use any tobacco products, including cigarettes, chewing tobacco, or electronic cigarettes. If you need help quitting, ask your doctor.  Do not drink alcohol.  Take medicines only as told by your doctor.  Keep all follow-up visits as told by your doctor. This is important. This includes any further testing if your chest pain does not go away.  Your doctor may tell you to keep your head raised (elevated) while you sleep.  Make lifestyle changes as told by your doctor. These may include:  Getting regular exercise. Ask your doctor to suggest some activities that are safe for you.  Eating a heart-healthy diet. Your doctor  or a diet specialist (dietitian) can help you to learn healthy eating options.  Maintaining a healthy weight.  Managing diabetes, if necessary.  Reducing stress. GET HELP IF:  Your chest pain does not go away, even after treatment.  You have a rash with blisters on your chest.  You have a fever. GET HELP RIGHT AWAY IF:  Your chest pain is worse.  You have an increasing cough, or you cough up blood.  You have severe belly (abdominal) pain.  You feel extremely weak.  You pass out (faint).  You have chills.  You have sudden, unexplained chest discomfort.  You have sudden, unexplained discomfort in your arms, back, neck, or jaw.  You have shortness of breath at any time.  You suddenly start to sweat, or your skin gets clammy.  You feel nauseous.  You vomit.  You suddenly feel light-headed or dizzy.  Your heart begins to beat quickly, or it feels like it is skipping beats. These symptoms may be an emergency. Do not wait to see if the symptoms will go away. Get medical help right away. Call your local emergency services (911 in the [place] Do not drive yourself to the hospital.   This information is not intended to replace advice given to you by your health care provider. Make sure you discuss any questions you have with your health care provider.   Document Released: 05/21/2008 Document Revised: 12/24/2014 Document Reviewed: 07/09/2014 Elsevier Interactive Patient Education 2016 [employer]  Chest  Wall Pain Chest wall pain is pain in or around the bones and muscles of your chest. Sometimes, an injury causes this pain. Sometimes, the cause may not be known. This pain may take several weeks or longer to get better. HOME CARE INSTRUCTIONS  Pay attention to any changes in your symptoms. Take these actions to help with your pain:   Rest as told by your health care provider.   Avoid activities that cause pain. These include any activities that use your chest muscles  or your abdominal and side muscles to lift heavy items.   If directed, apply ice to the painful area:  Put ice in a plastic bag.  Place a towel between your skin and the bag.  Leave the ice on for 20 minutes, 2-3 times per day.  Take over-the-counter and prescription medicines only as told by your health care provider.  Do not use tobacco products, including cigarettes, chewing tobacco, and e-cigarettes. If you need help quitting, ask your health care provider.  Keep all follow-up visits as told by your health care provider. This is important. SEEK MEDICAL CARE IF:  You have a fever.  Your chest pain becomes worse.  You have new symptoms. SEEK IMMEDIATE MEDICAL CARE IF:  You have nausea or vomiting.  You feel sweaty or light-headed.  You have a cough with phlegm (sputum) or you cough up blood.  You develop shortness of breath.   This information is not intended to replace advice given to you by your health care provider. Make sure you discuss any questions you have with your health care provider.   Document Released: 12/03/2005 Document Revised: 08/24/2015 Document Reviewed: 02/28/2015 Elsevier Interactive Patient Education [employer]

## 2016-04-23 ENCOUNTER — Emergency Department (HOSPITAL_COMMUNITY): Payer: Self-pay

## 2016-04-23 ENCOUNTER — Encounter (HOSPITAL_COMMUNITY): Payer: Self-pay | Admitting: Emergency Medicine

## 2016-04-23 ENCOUNTER — Emergency Department (HOSPITAL_COMMUNITY)
Admission: EM | Admit: 2016-04-23 | Discharge: 2016-04-23 | Disposition: A | Discharge status: Home / Self Care | Payer: Self-pay | Attending: Emergency Medicine | Admitting: Emergency Medicine

## 2016-04-23 DIAGNOSIS — F1290 Cannabis use, unspecified, uncomplicated: Secondary | ICD-10-CM

## 2016-04-23 DIAGNOSIS — F410 Panic disorder [episodic paroxysmal anxiety] without agoraphobia: Secondary | ICD-10-CM | POA: Insufficient documentation

## 2016-04-23 DIAGNOSIS — J45909 Unspecified asthma, uncomplicated: Secondary | ICD-10-CM | POA: Insufficient documentation

## 2016-04-23 DIAGNOSIS — R4182 Altered mental status, unspecified: Secondary | ICD-10-CM | POA: Insufficient documentation

## 2016-04-23 DIAGNOSIS — F17210 Nicotine dependence, cigarettes, uncomplicated: Secondary | ICD-10-CM | POA: Insufficient documentation

## 2016-04-23 DIAGNOSIS — F10120 Alcohol abuse with intoxication, uncomplicated: Secondary | ICD-10-CM | POA: Insufficient documentation

## 2016-04-23 DIAGNOSIS — F1299 Cannabis use, unspecified with unspecified cannabis-induced disorder: Secondary | ICD-10-CM | POA: Insufficient documentation

## 2016-04-23 DIAGNOSIS — F10920 Alcohol use, unspecified with intoxication, uncomplicated: Secondary | ICD-10-CM

## 2016-04-23 LAB — BASIC METABOLIC PANEL
ANION GAP: 11 (ref 5–15)
BUN: 12 mg/dL (ref 6–20)
CALCIUM: 8.7 mg/dL — AB (ref 8.9–10.3)
CHLORIDE: 109 mmol/L (ref 101–111)
CO2: 23 mmol/L (ref 22–32)
CREATININE: 0.87 mg/dL (ref 0.61–1.24)
GFR calc Af Amer: >60 mL/min (ref >60)
GFR calc non Af Amer: >60 mL/min (ref >60)
GLUCOSE: 92 mg/dL (ref 65–99)
Potassium: 3.8 mmol/L (ref 3.5–5.1)
Sodium: 143 mmol/L (ref 135–145)

## 2016-04-23 LAB — CBG MONITORING, ED: Glucose-Capillary: 91 mg/dL (ref 65–99)

## 2016-04-23 LAB — CBC
HEMATOCRIT: 41.6 % (ref 39.0–52.0)
Hemoglobin: 14.3 g/dL (ref 13.0–17.0)
MCH: 31 pg (ref 26.0–34.0)
MCHC: 34.4 g/dL (ref 30.0–36.0)
MCV: 90 fL (ref 78.0–100.0)
Platelets: 216 10*3/uL (ref 150–400)
RBC: 4.62 MIL/uL (ref 4.22–5.81)
RDW: 11.4 % — AB (ref 11.5–15.5)
WBC: 6.1 10*3/uL (ref 4.0–10.5)

## 2016-04-23 LAB — ETHANOL: Alcohol, Ethyl (B): 158 mg/dL — ABNORMAL HIGH (ref <5)

## 2016-04-23 MED ORDER — ONDANSETRON HCL 4 MG/2ML IJ SOLN
4.0000 mg | Freq: Once | INTRAMUSCULAR | Status: AC
  Administered 2016-04-23: 4 mg via INTRAVENOUS
  Filled 2016-04-23: qty 2

## 2016-04-23 MED ORDER — SODIUM CHLORIDE 0.9 % IV BOLUS (SEPSIS)
1000.0000 mL | Freq: Once | INTRAVENOUS | Status: AC
  Administered 2016-04-23: 1000 mL via INTRAVENOUS

## 2016-04-23 NOTE — ED Provider Notes (Signed)
CSN: [id number]     Arrival date & time 04/23/16  0118 History  By signing my name below, I, [facility], attest that this documentation has been prepared under the direction and in the presence of Azalia Bilis, MD. Electronically Signed: Randell Patient, ED Scribe. 04/23/2016. 3:39 AM.   Chief Complaint  Patient presents with  . Alcohol Intoxication  . Panic Attack   The history is provided by the patient and a friend. The history is limited by the condition of the patient. No language interpreter was used.   LEVEL V CAVEAT DE TO ETOH INTOXICATION  HPI Comments: Rochelle Nephew is a 20 y.o. male brought in by EMS who presents to the Emergency Department for ETOH intoxication and one anxiety attack that occurred shortly PTA. Friend states that the pt has been drinking all day and smoked some marijuana as well. He reports that the pt jumped out of his car earlier tonight and began to gasp for air and complained of not being able to breathe, followed by gagging, foaming at the mouth, and was unresponsive. He reports that that pt was seen 2 days ago for a breathing problem but he is unsure what the exact complaint was and what hospital the pt was seen at. He also notes that the pt has had problems with blood sugar in the past where if he doesn't eat or drink he will lose consciousness. Pt reports nausea and abdominal pain. Friend denies hx of seizures.  Past Medical History  Diagnosis Date  . Asthma    History reviewed. No pertinent past surgical history. Family History  Problem Relation Age of Onset  . Diabetes Other    Social History  Substance Use Topics  . Smoking status: Current Every Day Smoker -- 0.01 packs/day for .2 years    Types: Cigarettes  . Smokeless tobacco: Never Used  . Alcohol Use: Yes    Review of Systems  Unable to perform ROS: Other   Allergies  Banana; Coconut fatty acids; and Mushroom extract complex  Home Medications   Prior to Admission  medications   Medication Sig Start Date End Date Taking? Authorizing Provider  ibuprofen (ADVIL,MOTRIN) 200 MG tablet Take 400 mg by mouth every 6 (six) hours as needed for moderate pain.   Yes Historical Provider, MD  naproxen (NAPROSYN) 500 MG tablet Take 1 tablet (500 mg total) by mouth 2 (two) times daily as needed for mild pain or moderate pain (TAKE WITH MEALS.). 04/22/16  Yes Mercedes Camprubi-Soms, PA-C  desonide (DESOWEN) 0.05 % cream Apply topically 2 (two) times daily. Patient not taking: Reported on 03/17/2015 02/07/15   Derwood Kaplan, MD  permethrin (ELIMITE) 5 % cream Apply to entire body other than face - let sit for 14 hours then wash off, may repeat in 1 week if still having symptoms Patient not taking: Reported on 04/22/2016 03/17/15   Roxy Horseman, PA-C   BP 94/60 mmHg  Pulse 68  Temp(Src) 98.2 F (36.8 C) (Oral)  Resp 18  SpO2 97% Physical Exam  Constitutional: He is oriented to person, place, and time. He appears well-developed and well-nourished.  HENT:  Head: Normocephalic.  Eyes: EOM are normal.  Neck: Normal range of motion.  Pulmonary/Chest: Effort normal.  Abdominal: He exhibits no distension.  Musculoskeletal: Normal range of motion.  Neurological: He is alert and oriented to person, place, and time.  Opens eyes to voice and pain.  Follows simple commands.  Normal grip strength bilaterally.  No facial asymmetry.  Slight slurred speech.  Psychiatric: He has a normal mood and affect.  Nursing note and vitals reviewed.   ED Course  Procedures   DIAGNOSTIC STUDIES: Oxygen Saturation is 100% on RA, normal by my interpretation.    COORDINATION OF CARE: 1:46 AM Will order labs, head CT, IV fluids, and Zofran. Discussed treatment plan with pt at bedside and pt agreed to plan.   Labs Review Labs Reviewed  CBC - Abnormal; Notable for the following:    RDW 11.4 (*)    All other components within normal limits  BASIC METABOLIC PANEL - Abnormal; Notable for  the following:    Calcium 8.7 (*)    All other components within normal limits  ETHANOL - Abnormal; Notable for the following:    Alcohol, Ethyl (B) 158 (*)    All other components within normal limits  CBG MONITORING, ED    Imaging Review Dg Chest 2 View  04/22/2016  CLINICAL DATA:  Left-sided chest pain and shortness of breath since this afternoon. History of asthma and diabetes. EXAM: CHEST  2 VIEW COMPARISON:  10/18/2014 FINDINGS: The heart size and mediastinal contours are within normal limits. Both lungs are clear. The visualized skeletal structures are unremarkable. IMPRESSION: No active cardiopulmonary disease. Electronically Signed   By: Burman NievesWilliam  Stevens M.D.   On: 04/22/2016 05:35   Ct Head Wo Contrast  04/23/2016  CLINICAL DATA:  Altered mental status. EXAM: CT HEAD WITHOUT CONTRAST TECHNIQUE: Contiguous axial images were obtained from the base of the skull through the vertex without intravenous contrast. COMPARISON:  03/17/2015 FINDINGS: There is no intracranial hemorrhage, mass or evidence of acute infarction. There is no extra-axial fluid collection. Gray matter and white matter appear normal. Cerebral volume is normal for age. Brainstem and posterior fossa are unremarkable. The CSF spaces appear normal. The bony structures are intact. The visible portions of the paranasal sinuses are clear. The orbits are unremarkable. IMPRESSION: Normal brain Electronically Signed   By: Ellery Plunkaniel R Mitchell M.D.   On: 04/23/2016 02:56   I have personally reviewed and evaluated these images and lab results as part of my medical decision-making.   EKG Interpretation None      MDM   Final diagnoses:  Alcohol intoxication, uncomplicated (HCC)  Marijuana use    At time of discharge the patient is a rotatory in the emergency department.  He feels much better.  No tongue biting.  May represent seizure however I suspect the majority of this is secondary to acute alcohol intoxication.  Outpatient  primary care follow-up.  He understands to return in the ER for new or worsening symptoms.  Ambulatory without complaints at this time.  I personally performed the services described in this documentation, which was scribed in my presence. The recorded information has been reviewed and is accurate.       Azalia BilisKevin Francie Keeling, MD 04/23/16 [id number]

## 2016-04-23 NOTE — ED Notes (Signed)
Friend at bedside unable to charge to get numbers for d/c ride. [employer] called for possible ride, security states they are unable to transport patient and visitor back home because they [place] off campus.

## 2016-04-23 NOTE — Discharge Instructions (Signed)
Alcohol Intoxication  Alcohol intoxication occurs when the amount of alcohol that a person has consumed impairs his or her ability to mentally and physically function. Alcohol directly impairs the normal chemical activity of the brain. Drinking large amounts of alcohol can lead to changes in mental function and behavior, and it can cause many physical effects that can be harmful.   Alcohol intoxication can range in severity from mild to very severe. Various factors can affect the level of intoxication that occurs, such as the person's age, gender, weight, frequency of alcohol consumption, and the presence of other medical conditions (such as diabetes, seizures, or heart conditions). Dangerous levels of alcohol intoxication may occur when people drink large amounts of alcohol in a short period (binge drinking). Alcohol can also be especially dangerous when combined with certain prescription medicines or "recreational" drugs.  SIGNS AND SYMPTOMS  Some common signs and symptoms of mild alcohol intoxication include:  · Loss of coordination.  · Changes in mood and behavior.  · Impaired judgment.  · Slurred speech.  As alcohol intoxication progresses to more severe levels, other signs and symptoms will appear. These may include:  · Vomiting.  · Confusion and impaired memory.  · Slowed breathing.  · Seizures.  · Loss of consciousness.  DIAGNOSIS   Your health care provider will take a medical history and perform a physical exam. You will be asked about the amount and type of alcohol you have consumed. Blood tests will be done to measure the concentration of alcohol in your blood. In many places, your blood alcohol level must be lower than 80 mg/dL (0.08%) to legally drive. However, many dangerous effects of alcohol can occur at much lower levels.   TREATMENT   People with alcohol intoxication often do not require treatment. Most of the effects of alcohol intoxication are temporary, and they go away as the alcohol naturally  leaves the body. Your health care provider will monitor your condition until you are stable enough to go home. Fluids are sometimes given through an IV access tube to help prevent dehydration.   HOME CARE INSTRUCTIONS  · Do not drive after drinking alcohol.  · Stay hydrated. Drink enough water and fluids to keep your urine clear or pale yellow. Avoid caffeine.    · Only take over-the-counter or prescription medicines as directed by your health care provider.    SEEK MEDICAL CARE IF:   · You have persistent vomiting.    · You do not feel better after a few days.  · You have frequent alcohol intoxication. Your health care provider can help determine if you should see a substance use treatment counselor.  SEEK IMMEDIATE MEDICAL CARE IF:   · You become shaky or tremble when you try to stop drinking.    · You shake uncontrollably (seizure).    · You throw up (vomit) blood. This may be bright red or may look like black coffee grounds.    · You have blood in your stool. This may be bright red or may appear as a black, tarry, bad smelling stool.    · You become lightheaded or faint.    MAKE SURE YOU:   · Understand these instructions.  · Will watch your condition.  · Will get help right away if you are not doing well or get worse.     This information is not intended to replace advice given to you by your health care provider. Make sure you discuss any questions you have with your health care provider.       Document Released: 09/12/2005 Document Revised: 08/05/2013 Document Reviewed: 05/08/2013  Elsevier Interactive Patient Education ©2016 [employer]

## 2016-04-23 NOTE — ED Notes (Signed)
Pt able to ambulate to the bathroom and in the hall without assistance.  Pt denies nausea at this time.

## 2016-04-23 NOTE — ED Notes (Signed)
Brought in by EMS from [facility] with c/o alcohol intoxication and panic attack.  Per EMS, pt's friends called EMS and reported that pt was "having a panic attack"--- pt was hyperventilating and getting sick.   Pt was hyperventilating and dry heaving on EMS' arrival at the scene.  Pt reported that he had been drinking "hard liquor the whole day and then started to smoke pot".  Pt began having a "panic attack" after smoking pot.

## 2016-04-23 NOTE — ED Notes (Signed)
Bed: ZO10WA12 Expected date:  Expected time:  Means of arrival:  Comments: EMS 11M ETOH, panic attack

## 2017-03-15 ENCOUNTER — Encounter (HOSPITAL_COMMUNITY): Payer: Self-pay | Admitting: *Deleted

## 2017-03-15 ENCOUNTER — Emergency Department (HOSPITAL_COMMUNITY)
Admission: EM | Admit: 2017-03-15 | Discharge: 2017-03-15 | Disposition: A | Discharge status: Home / Self Care | Payer: Medicaid Other | Attending: Emergency Medicine | Admitting: Emergency Medicine

## 2017-03-15 DIAGNOSIS — J45909 Unspecified asthma, uncomplicated: Secondary | ICD-10-CM | POA: Insufficient documentation

## 2017-03-15 DIAGNOSIS — K029 Dental caries, unspecified: Secondary | ICD-10-CM

## 2017-03-15 DIAGNOSIS — F17210 Nicotine dependence, cigarettes, uncomplicated: Secondary | ICD-10-CM | POA: Insufficient documentation

## 2017-03-15 DIAGNOSIS — K0889 Other specified disorders of teeth and supporting structures: Secondary | ICD-10-CM

## 2017-03-15 MED ORDER — IBUPROFEN 800 MG PO TABS: 800.0000 mg | ORAL_TABLET | Freq: Three times a day (TID) | ORAL | 0 refills | Status: DC

## 2017-03-15 MED ORDER — PENICILLIN V POTASSIUM 500 MG PO TABS: 500.0000 mg | ORAL_TABLET | Freq: Three times a day (TID) | ORAL | 0 refills | Status: DC

## 2017-03-15 MED ORDER — BUPIVACAINE-EPINEPHRINE (PF) 0.5% -1:200000 IJ SOLN
1.8000 mL | Freq: Once | INTRAMUSCULAR | Status: AC
  Administered 2017-03-15: 1.8 mL
  Filled 2017-03-15: qty 1.8

## 2017-03-15 NOTE — ED Notes (Signed)
Muthersbaugh, PA at the bedside.

## 2017-03-15 NOTE — ED Triage Notes (Signed)
Pt c/o R sided dental and ear pain.

## 2017-03-15 NOTE — Discharge Instructions (Signed)
1. Medications: ibuprofen, penicillin, usual home medications °2. Treatment: rest, drink plenty of fluids, take medications as prescribed °3. Follow Up: Please followup with dentistry within 1 week for discussion of your diagnoses and further evaluation after today's visit; if you do not have a primary care doctor use the resource guide provided to find one; Return to the ER for high fevers, difficulty breathing, difficulty swallowing or other concerning symptoms ° °

## 2017-03-15 NOTE — ED Provider Notes (Signed)
[facility]-EMERGENCY DEPT Provider Note   CSN: [id number] Arrival date & time: 03/15/17  0400     History   Chief Complaint Chief Complaint  Patient presents with  . Dental Pain    HPI Cody Navarro is a 21 y.o. male with a hx of astjma presents to the Emergency Department complaining of gradual, persistent, progressively worsening Right lower dental pain onset 2 days ago. Patient reports that she has been broken for several weeks. He denies fevers or chills, nausea or vomiting. No difficulty swallowing.  Recent has taken ibuprofen without relief. He has not followed up with a dentist yet.  The history is provided by the patient and medical records. No language interpreter was used.    Past Medical History:  Diagnosis Date  . Asthma     There are no active problems to display for this patient.   History reviewed. No pertinent surgical history.     Home Medications    Prior to Admission medications   Medication Sig Start Date End Date Taking? Authorizing Provider  desonide (DESOWEN) 0.05 % cream Apply topically 2 (two) times daily. Patient not taking: Reported on 03/17/2015 02/07/15   Derwood Kaplan, MD  ibuprofen (ADVIL,MOTRIN) 800 MG tablet Take 1 tablet (800 mg total) by mouth 3 (three) times daily. 03/15/17   Eshal Propps, PA-C  naproxen (NAPROSYN) 500 MG tablet Take 1 tablet (500 mg total) by mouth 2 (two) times daily as needed for mild pain or moderate pain (TAKE WITH MEALS.). 04/22/16   Mercedes Street, PA-C  penicillin v potassium (VEETID) 500 MG tablet Take 1 tablet (500 mg total) by mouth 3 (three) times daily. 03/15/17   Landon Bassford, PA-C  permethrin (ELIMITE) 5 % cream Apply to entire body other than face - let sit for 14 hours then wash off, may repeat in 1 week if still having symptoms Patient not taking: Reported on 04/22/2016 03/17/15   Roxy Horseman, PA-C    Family History Family History  Problem Relation Age of Onset  . Diabetes Other     Social  History Social History  Substance Use Topics  . Smoking status: Current Every Day Smoker    Packs/day: 0.01    Years: 0.20    Types: Cigarettes  . Smokeless tobacco: Never Used  . Alcohol use Yes     Allergies   Banana; Coconut fatty acids; and Mushroom extract complex   Review of Systems Review of Systems  HENT: Positive for dental problem.   All other systems reviewed and are negative.    Physical Exam Updated Vital Signs BP (!) 146/103 (BP Location: Left Arm)   Pulse 71   Temp 98.6 F (37 C) (Oral)   Resp 18   SpO2 98%   Physical Exam  Constitutional: He appears well-developed and well-nourished.  HENT:  Head: Normocephalic.  Right Ear: Tympanic membrane, external ear and ear canal normal.  Left Ear: Tympanic membrane, external ear and ear canal normal.  Nose: Nose normal. Right sinus exhibits no maxillary sinus tenderness and no frontal sinus tenderness. Left sinus exhibits no maxillary sinus tenderness and no frontal sinus tenderness.  Mouth/Throat: Uvula is midline, oropharynx is clear and moist and mucous membranes are normal. No oral lesions. Abnormal dentition. Dental caries present. No uvula swelling or lacerations. No oropharyngeal exudate, posterior oropharyngeal edema, posterior oropharyngeal erythema or tonsillar abscesses.  Tooth # 30 with large dental carrie No gross abscess No fluctuance or induration to the buccal mucosa or floor of the mouth  Eyes: Conjunctivae are normal. Pupils are equal, round, and reactive to light. Right eye exhibits no discharge. Left eye exhibits no discharge.  Neck: Normal range of motion. Neck supple.  No stridor Handling secretions without difficulty No nuchal rigidity No cervical lymphadenopathy   Cardiovascular: Normal rate, regular rhythm and normal heart sounds.   Pulmonary/Chest: Effort normal. No respiratory distress.  Equal chest rise  Abdominal: Soft. Bowel sounds are normal. He exhibits no distension. There is  no tenderness.  Lymphadenopathy:    He has no cervical adenopathy.  Neurological: He is alert.  Skin: Skin is warm and dry.  Psychiatric: He has a normal mood and affect.  Nursing note and vitals reviewed.    ED Treatments / Results  Labs (all labs ordered are listed, but only abnormal results are displayed) Labs Reviewed - No data to display  EKG  EKG Interpretation None       Radiology No results found.  Procedures Dental Block Date/Time: 03/15/2017 6:29 AM Performed by: Dierdre Forth Authorized by: Dierdre Forth   Consent:    Consent obtained:  Verbal   Consent given by:  Patient   Risks discussed:  Allergic reaction, infection, intravascular injection, pain, swelling and unsuccessful block   Alternatives discussed:  No treatment Indications:    Indications: dental pain   Location:    Block type:  Supraperiosteal   Supraperiosteal location:  Lower teeth   Lower teeth location:  30/RL 1st molar Procedure details (see MAR for exact dosages):    Syringe type:  Aspirating dental syringe   Needle gauge:  27 G   Anesthetic injected:  Bupivacaine 0.5% WITH epi   Injection procedure:  Anatomic landmarks identified, anatomic landmarks palpated, introduced needle, negative aspiration for blood and incremental injection Post-procedure details:    Outcome:  Pain relieved   Patient tolerance of procedure:  Tolerated well, no immediate complications   (including critical care time)  Medications Ordered in ED Medications  bupivacaine-epinephrine (MARCAINE W/ EPI) 0.5% -1:200000 injection 1.8 mL (1.8 mLs Infiltration Given 03/15/17 0604)     Initial Impression / Assessment and Plan / ED Course  I have reviewed the triage vital signs and the nursing notes.  Pertinent labs & imaging results that were available during my care of the patient were reviewed by me and considered in my medical decision making (see chart for details).     Patient with  toothache.  No gross abscess.  Exam unconcerning for Ludwig's angina or spread of infection.  Will treat with penicillin and ibuprofen.  Urged patient to follow-up with dentist.     Final Clinical Impressions(s) / ED Diagnoses   Final diagnoses:  Dental caries  Pain, dental    New Prescriptions New Prescriptions   IBUPROFEN (ADVIL,MOTRIN) 800 MG TABLET    Take 1 tablet (800 mg total) by mouth 3 (three) times daily.   PENICILLIN V POTASSIUM (VEETID) 500 MG TABLET    Take 1 tablet (500 mg total) by mouth 3 (three) times daily.     Dahlia Client Hadley Detloff, PA-C 03/15/17 0630    Tomasita Crumble, MD 03/15/17 [phone]

## 2017-06-08 ENCOUNTER — Encounter (HOSPITAL_COMMUNITY): Payer: Self-pay | Admitting: Emergency Medicine

## 2017-06-08 ENCOUNTER — Emergency Department (HOSPITAL_COMMUNITY): Payer: Medicaid Other

## 2017-06-08 ENCOUNTER — Emergency Department (HOSPITAL_COMMUNITY)
Admission: EM | Admit: 2017-06-08 | Discharge: 2017-06-08 | Disposition: A | Discharge status: Home / Self Care | Payer: Medicaid Other | Attending: Emergency Medicine | Admitting: Emergency Medicine

## 2017-06-08 DIAGNOSIS — J45909 Unspecified asthma, uncomplicated: Secondary | ICD-10-CM | POA: Insufficient documentation

## 2017-06-08 DIAGNOSIS — F17210 Nicotine dependence, cigarettes, uncomplicated: Secondary | ICD-10-CM | POA: Insufficient documentation

## 2017-06-08 DIAGNOSIS — R569 Unspecified convulsions: Secondary | ICD-10-CM

## 2017-06-08 DIAGNOSIS — T5191XA Toxic effect of unspecified alcohol, accidental (unintentional), initial encounter: Secondary | ICD-10-CM | POA: Insufficient documentation

## 2017-06-08 DIAGNOSIS — Z789 Other specified health status: Secondary | ICD-10-CM

## 2017-06-08 LAB — COMPREHENSIVE METABOLIC PANEL
ALK PHOS: 39 U/L (ref 38–126)
ALT: 18 U/L (ref 17–63)
ANION GAP: 7 (ref 5–15)
AST: 24 U/L (ref 15–41)
Albumin: 4.1 g/dL (ref 3.5–5.0)
BUN: 9 mg/dL (ref 6–20)
CALCIUM: 8.6 mg/dL — AB (ref 8.9–10.3)
CO2: 26 mmol/L (ref 22–32)
CREATININE: 1 mg/dL (ref 0.61–1.24)
Chloride: 108 mmol/L (ref 101–111)
Glucose, Bld: 99 mg/dL (ref 65–99)
Potassium: 3.1 mmol/L — ABNORMAL LOW (ref 3.5–5.1)
SODIUM: 141 mmol/L (ref 135–145)
Total Bilirubin: 2.1 mg/dL — ABNORMAL HIGH (ref 0.3–1.2)
Total Protein: 7.1 g/dL (ref 6.5–8.1)

## 2017-06-08 LAB — CBC WITH DIFFERENTIAL/PLATELET
BASOS ABS: 0 10*3/uL (ref 0.0–0.1)
BASOS PCT: 0 %
EOS ABS: 0 10*3/uL (ref 0.0–0.7)
EOS PCT: 0 %
HEMATOCRIT: 39 % (ref 39.0–52.0)
Hemoglobin: 13.3 g/dL (ref 13.0–17.0)
Lymphocytes Relative: 39 %
Lymphs Abs: 2.3 10*3/uL (ref 0.7–4.0)
MCH: 30.4 pg (ref 26.0–34.0)
MCHC: 34.1 g/dL (ref 30.0–36.0)
MCV: 89.2 fL (ref 78.0–100.0)
MONO ABS: 0.3 10*3/uL (ref 0.1–1.0)
MONOS PCT: 5 %
NEUTROS ABS: 3.3 10*3/uL (ref 1.7–7.7)
Neutrophils Relative %: 56 %
PLATELETS: 193 10*3/uL (ref 150–400)
RBC: 4.37 MIL/uL (ref 4.22–5.81)
RDW: 11.5 % (ref 11.5–15.5)
WBC: 5.8 10*3/uL (ref 4.0–10.5)

## 2017-06-08 LAB — ETHANOL: ALCOHOL ETHYL (B): 147 mg/dL — AB (ref <5)

## 2017-06-08 LAB — ACETAMINOPHEN LEVEL

## 2017-06-08 LAB — SALICYLATE LEVEL

## 2017-06-08 MED ORDER — NALOXONE HCL 0.4 MG/ML IJ SOLN
INTRAMUSCULAR | Status: AC
  Administered 2017-06-08: 0.4 mg via INTRAVENOUS
  Filled 2017-06-08: qty 1

## 2017-06-08 MED ORDER — NALOXONE HCL 0.4 MG/ML IJ SOLN
0.4000 mg | Freq: Once | INTRAMUSCULAR | Status: AC
  Administered 2017-06-08: 0.4 mg via INTRAVENOUS

## 2017-06-08 NOTE — ED Notes (Signed)
Bed: WA21 Expected date:  Expected time:  Means of arrival:  Comments: EMS seizure 

## 2017-06-08 NOTE — ED Triage Notes (Signed)
Per EMS pt was reported by friends to have had witnessed seizure activity. Friends on scene reported to EMS that pt has hx of seizures. EMS reports pt activity was not typical seizure-like activity. Pt was breathing rapidly and spitting upon arrival. Pt was given 2.5 Diazelam.   CBG 61- was given 1/2 amp of D50 HR 90 (sinus) 18g Left Forearm (received 350-46100mL of fluid prior to arrival)

## 2017-06-08 NOTE — Discharge Instructions (Signed)
Please see the Neurologist for optimal care since this was a 2nd episode of seizure. It is possible that the seizure was due to drug use/ alcohol abuse, so refrain from any of that for now. Return to the ER if there is a repeat seizure.  [place] law prevents people with seizures or fainting from driving or operating dangerous machinery until they are free of seizures or fainting for 6 months.

## 2017-06-08 NOTE — ED Notes (Signed)
Pts Sister [phone] Pts girlfriend [phone]

## 2017-06-08 NOTE — ED Provider Notes (Signed)
[facility]-EMERGENCY DEPT Provider Note   CSN: [id number] Arrival date & time: 06/08/17  0258  By signing my name below, I, Diona Browner, attest that this documentation has been prepared under the direction and in the presence of Derwood Kaplan, MD. Electronically Signed: Diona Browner, ED Scribe. 06/08/17. 3:38 AM.  LEVEL V CAVEAT: HPI and ROS limited due to patient unresponsive.   History   Chief Complaint Chief Complaint  Patient presents with  . Seizures    HPI Cody Navarro is a 21 y.o. male BIB EMS with a PMHx of seizures who presents to the Emergency Department with a chief complain of a witnessed seizure that occurred shortly PTA. Pt was with friends at a party where he may or may not have been drinking ETOH and taking drugs. Pt has been unresponsive since arriving.   The history is provided by the patient. The history is limited by the condition of the patient. No language interpreter was used.    Past Medical History:  Diagnosis Date  . Asthma     There are no active problems to display for this patient.   History reviewed. No pertinent surgical history.     Home Medications    Prior to Admission medications   Not on File    Family History Family History  Problem Relation Age of Onset  . Diabetes Other     Social History Social History  Substance Use Topics  . Smoking status: Current Every Day Smoker    Packs/day: 0.01    Years: 0.20    Types: Cigarettes  . Smokeless tobacco: Never Used  . Alcohol use Yes     Allergies   Banana and Food   Review of Systems Review of Systems  Unable to perform ROS: Patient unresponsive     Physical Exam Updated Vital Signs BP 102/67   Pulse 78   Temp 97.6 F (36.4 C) (Axillary)   Resp 16   Ht 5\' 8"  (1.727 m)   Wt 81.6 kg (180 lb)   SpO2 100%   BMI 27.37 kg/m   Physical Exam  Constitutional: He is oriented to person, place, and time. He appears well-developed and well-nourished.  HENT:    Head: Normocephalic.  Positive gag reflex. Pinpin oules. No sign of traume to face or head.   Eyes: EOM are normal.  Neck: Normal range of motion.  Cardiovascular: Normal rate, regular rhythm and normal heart sounds.   Pulmonary/Chest: Effort normal.  Lungs are clear to ausculation.   Abdominal: Soft. He exhibits no distension.  Genitourinary:  Genitourinary Comments: Pelvis is stable.  Musculoskeletal: Normal range of motion.  No gross deformity noted.  Neurological: He is alert and oriented to person, place, and time.  Psychiatric: He has a normal mood and affect.  Nursing note and vitals reviewed.    ED Treatments / Results  DIAGNOSTIC STUDIES: Oxygen Saturation is 97% on RA, normal by my interpretation.   Labs (all labs ordered are listed, but only abnormal results are displayed) Labs Reviewed  COMPREHENSIVE METABOLIC PANEL - Abnormal; Notable for the following:       Result Value   Potassium 3.1 (*)    Calcium 8.6 (*)    Total Bilirubin 2.1 (*)    All other components within normal limits  ETHANOL - Abnormal; Notable for the following:    Alcohol, Ethyl (B) 147 (*)    All other components within normal limits  ACETAMINOPHEN LEVEL - Abnormal; Notable for the following:  Acetaminophen (Tylenol), Serum <10 (*)    All other components within normal limits  SALICYLATE LEVEL  CBC WITH DIFFERENTIAL/PLATELET    EKG  EKG Interpretation None       Radiology Ct Head Wo Contrast  Result Date: 06/08/2017 CLINICAL DATA:  Seizure. EXAM: CT HEAD WITHOUT CONTRAST TECHNIQUE: Contiguous axial images were obtained from the base of the skull through the vertex without intravenous contrast. COMPARISON:  Head CT 04/23/2016 FINDINGS: Mild motion artifact. Brain: No evidence of acute infarction, hemorrhage, hydrocephalus, extra-axial collection or mass lesion/mass effect. Vascular: No hyperdense vessel or unexpected calcification. Skull: Normal. Negative for fracture or focal  lesion. Sinuses/Orbits: Paranasal sinuses and mastoid air cells are clear. The visualized orbits are unremarkable. Other: None. IMPRESSION: No acute intracranial abnormality. Electronically Signed   By: Rubye OaksMelanie  Ehinger M.D.   On: 06/08/2017 04:51    Procedures Procedures (including critical care time)  Medications Ordered in ED Medications  naloxone [employer](NARCAN) injection 0.4 mg (0.4 mg Intravenous Given 06/08/17 0337)     Initial Impression / Assessment and Plan / ED Course  I have reviewed the triage vital signs and the nursing notes.  Pertinent labs & imaging results that were available during my care of the patient were reviewed by me and considered in my medical decision making (see chart for details).  Clinical Course as of Jun 09 751  Sat Jun 08, 2017  0750 Pt awake now. He reports he had a seizure like episode last year. He clearly doesn't remember what happened at he party. He is not sure if he used drugs.  Patient is clinically sober. He is talking coherently, gait is normal, and is demonstrating rational thought process. We shall discharge him shortly, and we have discussed the warning signs of alcohol withdrawal with him verbally, and the information will be provided with the discharge instructions as well.   [AN]    Clinical Course User Index [AN] Derwood KaplanNanavati, Kanyon Seibold, MD    Pt comes in after a seizure witnessed at a party. Pt is unresponsive. Pt was given 2.5 mg iv valium by EMS, but they report that he didn't have a typical seizure like activity to RN - I unfortunately didn't have an encounter with them.  Pin point pupils - so 0.4 mg narcan given - no response. Will get CT head and basic labs.    Final Clinical Impressions(s) / ED Diagnoses   Final diagnoses:  Alcohol ingestion  Seizure (HCC)    New Prescriptions New Prescriptions   No medications on file   I personally performed the services described in this documentation, which was scribed in my presence. The  recorded information has been reviewed and is accurate.     Derwood KaplanNanavati, Denver Bentson, MD 06/08/17 [phone]

## 2017-06-29 ENCOUNTER — Emergency Department (HOSPITAL_COMMUNITY)
Admission: EM | Admit: 2017-06-29 | Discharge: 2017-06-29 | Disposition: A | Discharge status: Home / Self Care | Payer: Medicaid Other | Attending: Emergency Medicine | Admitting: Emergency Medicine

## 2017-06-29 ENCOUNTER — Encounter (HOSPITAL_COMMUNITY): Payer: Self-pay | Admitting: *Deleted

## 2017-06-29 DIAGNOSIS — Z5321 Procedure and treatment not carried out due to patient leaving prior to being seen by health care provider: Secondary | ICD-10-CM | POA: Insufficient documentation

## 2017-06-29 LAB — COMPREHENSIVE METABOLIC PANEL
ALBUMIN: 4.4 g/dL (ref 3.5–5.0)
ALK PHOS: 42 U/L (ref 38–126)
ALT: 15 U/L — ABNORMAL LOW (ref 17–63)
ANION GAP: 8 (ref 5–15)
AST: 21 U/L (ref 15–41)
BILIRUBIN TOTAL: 2.3 mg/dL — AB (ref 0.3–1.2)
BUN: 16 mg/dL (ref 6–20)
CO2: 26 mmol/L (ref 22–32)
Calcium: 9.4 mg/dL (ref 8.9–10.3)
Chloride: 104 mmol/L (ref 101–111)
Creatinine, Ser: 1.08 mg/dL (ref 0.61–1.24)
GFR calc Af Amer: >60 mL/min (ref >60)
GLUCOSE: 110 mg/dL — AB (ref 65–99)
Potassium: 4.2 mmol/L (ref 3.5–5.1)
Sodium: 138 mmol/L (ref 135–145)
TOTAL PROTEIN: 7.8 g/dL (ref 6.5–8.1)

## 2017-06-29 LAB — URINALYSIS, ROUTINE W REFLEX MICROSCOPIC
BILIRUBIN URINE: NEGATIVE
Glucose, UA: NEGATIVE mg/dL
Hgb urine dipstick: NEGATIVE
KETONES UR: 20 mg/dL — AB
LEUKOCYTES UA: NEGATIVE
NITRITE: NEGATIVE
Protein, ur: NEGATIVE mg/dL
Specific Gravity, Urine: 1.024 (ref 1.005–1.030)
pH: 7 (ref 5.0–8.0)

## 2017-06-29 LAB — CBC
HCT: 45.1 % (ref 39.0–52.0)
Hemoglobin: 15.5 g/dL (ref 13.0–17.0)
MCH: 30.7 pg (ref 26.0–34.0)
MCHC: 34.4 g/dL (ref 30.0–36.0)
MCV: 89.3 fL (ref 78.0–100.0)
PLATELETS: 256 10*3/uL (ref 150–400)
RBC: 5.05 MIL/uL (ref 4.22–5.81)
RDW: 11.5 % (ref 11.5–15.5)
WBC: 11.3 10*3/uL — AB (ref 4.0–10.5)

## 2017-06-29 LAB — LIPASE, BLOOD: Lipase: 23 U/L (ref 11–51)

## 2017-06-29 NOTE — ED Triage Notes (Signed)
Pt called EMS after 2 hours of nausea with 3 times emesis, none en route to hosp. Pt ambulatory to EMS truck.VSS

## 2017-06-29 NOTE — ED Notes (Signed)
Pt called for v/s recheck with no response from lobby 

## 2017-08-22 DIAGNOSIS — Z87891 Personal history of nicotine dependence: Secondary | ICD-10-CM | POA: Insufficient documentation

## 2017-08-22 DIAGNOSIS — E119 Type 2 diabetes mellitus without complications: Secondary | ICD-10-CM | POA: Insufficient documentation

## 2017-08-22 DIAGNOSIS — Z7251 High risk heterosexual behavior: Secondary | ICD-10-CM | POA: Insufficient documentation

## 2017-08-22 DIAGNOSIS — J45909 Unspecified asthma, uncomplicated: Secondary | ICD-10-CM | POA: Insufficient documentation

## 2017-08-22 DIAGNOSIS — R369 Urethral discharge, unspecified: Secondary | ICD-10-CM | POA: Insufficient documentation

## 2017-08-22 DIAGNOSIS — Z711 Person with feared health complaint in whom no diagnosis is made: Secondary | ICD-10-CM | POA: Insufficient documentation

## 2017-08-23 ENCOUNTER — Encounter (HOSPITAL_COMMUNITY): Payer: Self-pay | Admitting: Emergency Medicine

## 2017-08-23 ENCOUNTER — Emergency Department (HOSPITAL_COMMUNITY)
Admission: EM | Admit: 2017-08-23 | Discharge: 2017-08-23 | Disposition: A | Discharge status: Home / Self Care | Payer: Self-pay | Attending: Emergency Medicine | Admitting: Emergency Medicine

## 2017-08-23 DIAGNOSIS — Z711 Person with feared health complaint in whom no diagnosis is made: Secondary | ICD-10-CM

## 2017-08-23 DIAGNOSIS — R369 Urethral discharge, unspecified: Secondary | ICD-10-CM

## 2017-08-23 DIAGNOSIS — Z7251 High risk heterosexual behavior: Secondary | ICD-10-CM

## 2017-08-23 LAB — HIV ANTIBODY (ROUTINE TESTING W REFLEX): HIV SCREEN 4TH GENERATION: NONREACTIVE

## 2017-08-23 MED ORDER — CEFTRIAXONE SODIUM 250 MG IJ SOLR
INTRAMUSCULAR | Status: AC
  Filled 2017-08-23: qty 250

## 2017-08-23 MED ORDER — AZITHROMYCIN 250 MG PO TABS
ORAL_TABLET | ORAL | Status: AC
  Filled 2017-08-23: qty 4

## 2017-08-23 MED ORDER — METRONIDAZOLE 500 MG PO TABS
ORAL_TABLET | ORAL | Status: AC
  Filled 2017-08-23: qty 4

## 2017-08-23 MED ORDER — CEFTRIAXONE SODIUM 250 MG IJ SOLR
250.0000 mg | Freq: Once | INTRAMUSCULAR | Status: AC
  Administered 2017-08-23: 250 mg via INTRAMUSCULAR

## 2017-08-23 MED ORDER — STERILE WATER FOR INJECTION IJ SOLN
INTRAMUSCULAR | Status: AC
  Administered 2017-08-23: 1 mL
  Filled 2017-08-23: qty 10

## 2017-08-23 MED ORDER — AZITHROMYCIN 250 MG PO TABS
1000.0000 mg | ORAL_TABLET | Freq: Once | ORAL | Status: AC
  Administered 2017-08-23: 1000 mg via ORAL

## 2017-08-23 MED ORDER — METRONIDAZOLE 500 MG PO TABS
2000.0000 mg | ORAL_TABLET | Freq: Once | ORAL | Status: AC
  Administered 2017-08-23: 2000 mg via ORAL

## 2017-08-23 NOTE — ED Triage Notes (Signed)
Pt states he is having yellow penile discharge that started today

## 2017-08-23 NOTE — Discharge Instructions (Signed)
PLEASE PRACTICE SAFE SEX. PLEASE INFORM YOUR PARTNER TO GET CHECKED AND TREATED FOR STD AS WELL.

## 2017-08-23 NOTE — ED Notes (Signed)
Bed: WTR7 Expected date:  Expected time:  Means of arrival:  Comments: 

## 2017-08-23 NOTE — ED Provider Notes (Signed)
  [facility]-EMERGENCY DEPT Provider Note   CSN: [id number] Arrival date & time: 08/22/17  2344     History   Chief Complaint Chief Complaint  Patient presents with  . Penile Discharge    HPI Cody Navarro is a 21 y.o. male.  HPI  SUBJECTIVE:  21 y.o. male complains of creamy discharge for 1 day. Denies pelvic pain or rash. Pt has no fever. No UTI symptoms. Denies history of known exposure to STD.   Past Medical History:  Diagnosis Date  . Asthma   . Diabetes mellitus without complication (HCC)     There are no active problems to display for this patient.   History reviewed. No pertinent surgical history.     Home Medications    Prior to Admission medications   Not on File    Family History Family History  Problem Relation Age of Onset  . Diabetes Other     Social History Social History  Substance Use Topics  . Smoking status: Former Smoker    Packs/day: 0.01    Years: 0.20    Types: Cigarettes  . Smokeless tobacco: Never Used  . Alcohol use Yes     Allergies   Banana and Food   Review of Systems Review of Systems  Constitutional: Negative for fever.  Genitourinary: Positive for discharge.  Skin: Negative for rash.  Allergic/Immunologic: Negative for immunocompromised state.     Physical Exam Updated Vital Signs BP (!) 144/102 (BP Location: Right Arm)   Pulse 65   Temp 98.2 F (36.8 C) (Oral)   Resp 18   Ht 5\' 11"  (1.803 m)   Wt 81.6 kg (180 lb)   SpO2 100%   BMI 25.10 kg/m   Physical Exam  Constitutional: He appears well-developed.  HENT:  Head: Atraumatic.  Neck: Neck supple.  Cardiovascular: Normal rate.   Pulmonary/Chest: Effort normal.  Genitourinary:  Genitourinary Comments: Penile discharge - yellow  Neurological: He is alert.  Skin: Skin is warm.  Nursing note and vitals reviewed.    ED Treatments / Results  Labs (all labs ordered are listed, but only abnormal results are displayed) Labs Reviewed  HIV ANTIBODY  (ROUTINE TESTING)  GC/CHLAMYDIA PROBE AMP ([place]    EKG  EKG Interpretation None       Radiology No results found.  Procedures Procedures (including critical care time)  Medications Ordered in ED Medications  azithromycin (ZITHROMAX) tablet 1,000 mg (not administered)  cefTRIAXone (ROCEPHIN) injection 250 mg (not administered)  metroNIDAZOLE (FLAGYL) tablet 2,000 mg (not administered)     Initial Impression / Assessment and Plan / ED Course  I have reviewed the triage vital signs and the nursing notes.  Pertinent labs & imaging results that were available during my care of the patient were reviewed by me and considered in my medical decision making (see chart for details).    Will give ceftriaxone IM, Azithromycin and flagyl. HIV screen sent.  Final Clinical Impressions(s) / ED Diagnoses   Final diagnoses:  Unprotected sex  Urethral discharge in male  Concern about STD in male without diagnosis    New Prescriptions New Prescriptions   No medications on file     Derwood KaplanNanavati, Keylan Costabile, MD 08/23/17 [phone]

## 2017-08-26 LAB — GC/CHLAMYDIA PROBE AMP (~~LOC~~) NOT AT ARMC
CHLAMYDIA, DNA PROBE: NEGATIVE
Neisseria Gonorrhea: NEGATIVE

## 2018-02-26 ENCOUNTER — Emergency Department (HOSPITAL_COMMUNITY)
Admission: EM | Admit: 2018-02-26 | Discharge: 2018-02-26 | Disposition: A | Discharge status: Home / Self Care | Payer: Self-pay | Attending: Emergency Medicine | Admitting: Emergency Medicine

## 2018-02-26 ENCOUNTER — Other Ambulatory Visit: Payer: Self-pay

## 2018-02-26 ENCOUNTER — Encounter (HOSPITAL_COMMUNITY): Payer: Self-pay | Admitting: Emergency Medicine

## 2018-02-26 DIAGNOSIS — Z87891 Personal history of nicotine dependence: Secondary | ICD-10-CM | POA: Insufficient documentation

## 2018-02-26 DIAGNOSIS — E119 Type 2 diabetes mellitus without complications: Secondary | ICD-10-CM | POA: Insufficient documentation

## 2018-02-26 DIAGNOSIS — R369 Urethral discharge, unspecified: Secondary | ICD-10-CM | POA: Insufficient documentation

## 2018-02-26 LAB — URINALYSIS, ROUTINE W REFLEX MICROSCOPIC
BILIRUBIN URINE: NEGATIVE
GLUCOSE, UA: NEGATIVE mg/dL
Hgb urine dipstick: NEGATIVE
KETONES UR: NEGATIVE mg/dL
Nitrite: NEGATIVE
PH: 6 (ref 5.0–8.0)
Protein, ur: NEGATIVE mg/dL
SPECIFIC GRAVITY, URINE: 1.029 (ref 1.005–1.030)
SQUAMOUS EPITHELIAL / LPF: NONE SEEN

## 2018-02-26 LAB — HIV ANTIBODY (ROUTINE TESTING W REFLEX): HIV Screen 4th Generation wRfx: NONREACTIVE

## 2018-02-26 LAB — RPR: RPR Ser Ql: NONREACTIVE

## 2018-02-26 MED ORDER — STERILE WATER FOR INJECTION IJ SOLN
INTRAMUSCULAR | Status: AC
Start: 2018-02-26 — End: 2018-02-26
  Administered 2018-02-26: 1.2 mL
  Filled 2018-02-26: qty 10

## 2018-02-26 MED ORDER — AZITHROMYCIN 250 MG PO TABS
1000.0000 mg | ORAL_TABLET | Freq: Once | ORAL | Status: AC
  Administered 2018-02-26: 1000 mg via ORAL
  Filled 2018-02-26: qty 4

## 2018-02-26 MED ORDER — CEFTRIAXONE SODIUM 250 MG IJ SOLR
250.0000 mg | Freq: Once | INTRAMUSCULAR | Status: AC
  Administered 2018-02-26: 250 mg via INTRAMUSCULAR
  Filled 2018-02-26: qty 250

## 2018-02-26 NOTE — ED Triage Notes (Signed)
Patient states he is having discharge from his penis. Patient thinks he has gonorrhea.

## 2018-02-26 NOTE — ED Provider Notes (Signed)
[facility]-EMERGENCY DEPT Provider Note   CSN: [id number] Arrival date & time: 02/26/18  0100     History   Chief Complaint Chief Complaint  Patient presents with  . SEXUALLY TRANSMITTED DISEASE    HPI Cody Navarro is a 22 y.o. male.  HPI 22 year old AA male with no pertinent past medical history presents to the ED with penile discharge.  Concern for possible gonorrhea.  Patient states that he was with "a girl who states that she might have gonorrhea several months ago".  Patient states for the past 2 days he has had some clear to white discharge from the end of his penis.  Patient reports some mild irritation.  Denies any associated dysuria, genital lesions, abdominal pain, nausea, emesis, fevers.  Denies any testicular pain or swelling.  He is not take anything for his symptoms prior to arrival.  Nothing makes better or worse. Past Medical History:  Diagnosis Date  . Asthma   . Diabetes mellitus without complication (HCC)     There are no active problems to display for this patient.   History reviewed. No pertinent surgical history.     Home Medications    Prior to Admission medications   Not on File    Family History Family History  Problem Relation Age of Onset  . Diabetes Other     Social History Social History   Tobacco Use  . Smoking status: Former Smoker    Packs/day: 0.01    Years: 0.20    Pack years: 0.00    Types: Cigarettes  . Smokeless tobacco: Never Used  Substance Use Topics  . Alcohol use: Yes  . Drug use: No     Allergies   Banana and Food   Review of Systems Review of Systems  All other systems reviewed and are negative.    Physical Exam Updated Vital Signs BP 130/87 (BP Location: Left Arm)   Pulse 64   Temp 98.4 F (36.9 C) (Oral)   Resp 16   Ht  (1.803 m)   Wt 74.3 kg (163 lb 12.8 oz)   SpO2 98%   BMI 22.85 kg/m   Physical Exam  Constitutional: He appears well-developed and  well-nourished. No distress.  HENT:  Head: Normocephalic and atraumatic.  Eyes: Right eye exhibits no discharge. Left eye exhibits no discharge. No scleral icterus.  Neck: Normal range of motion.  Pulmonary/Chest: No respiratory distress.  Genitourinary:  Genitourinary Comments: Chaperone present for exam. Circumcised male. No erythema, tenderness, lesion, or rash.  Clear white penile discharge noted.  2 descended testes without swelling, pain, lesions or rash. No inguinal lymphadenopathy or hernia.    Musculoskeletal: Normal range of motion.  Neurological: He is alert.  Skin: No pallor.  Psychiatric: His behavior is normal. Judgment and thought content normal.  Nursing note and vitals reviewed.    ED Treatments / Results  Labs (all labs ordered are listed, but only abnormal results are displayed) Labs Reviewed  URINALYSIS, ROUTINE W REFLEX MICROSCOPIC - Abnormal; Notable for the following components:      Result Value   Leukocytes, UA TRACE (*)    Bacteria, UA RARE (*)    All other components within normal limits  RPR  HIV ANTIBODY (ROUTINE TESTING)  GC/CHLAMYDIA PROBE AMP ([place]) NOT AT [facility]    EKG  EKG Interpretation None       Radiology No results found.  Procedures Procedures (including critical care time)  Medications Ordered in ED Medications  cefTRIAXone (ROCEPHIN) injection 250 mg (250 mg Intramuscular Given 02/26/18 0533)  azithromycin (ZITHROMAX) tablet 1,000 mg (1,000 mg Oral Given 02/26/18 0532)  sterile water (preservative free) injection (1.2 mLs  Given 02/26/18 0534)     Initial Impression / Assessment and Plan / ED Course  I have reviewed the triage vital signs and the nursing notes.  Pertinent labs & imaging results that were available during my care of the patient were reviewed by me and considered in my medical decision making (see chart for details).     Patient treated in the ED for STI with Rocephin and azithromycin. Patient advised  to inform and treat all sexual partners.  Pt advised on safe sex practices and understands that they have GC/Chlamydia cultures pending and will result in 2-3 days. HIV and RPR sent. Pt encouraged to follow up at local health department for future STI checks. No concern for prostatitis or epididymitis. Discussed return precautions. Pt appears safe for discharge.    Final Clinical Impressions(s) / ED Diagnoses   Final diagnoses:  Penile discharge    ED Discharge Orders    None       Wallace Keller 02/26/18 0640    Palumbo, April, MD 02/26/18 [phone]

## 2018-02-26 NOTE — Discharge Instructions (Signed)
You have been treated for a sexually transmitted disease in the emergency room.  Avoid sexual contact for 14 days until infection has cleared.  Follow-up with health department for any further testing.  Your test results are pending and will be notified if they are positive.  Return to the ED with any worsening symptoms.  Inform all sexual partners that you have been treated for an STD.

## 2018-02-26 NOTE — ED Notes (Signed)
Bed: [facility]1 Expected date:  Expected time:  Means of arrival:  Comments: 

## 2018-02-27 LAB — GC/CHLAMYDIA PROBE AMP (~~LOC~~) NOT AT ARMC
Chlamydia: POSITIVE — AB
NEISSERIA GONORRHEA: NEGATIVE

## 2018-09-08 ENCOUNTER — Encounter (HOSPITAL_COMMUNITY): Payer: Self-pay | Admitting: Emergency Medicine

## 2018-09-08 ENCOUNTER — Other Ambulatory Visit: Payer: Self-pay

## 2018-09-08 ENCOUNTER — Emergency Department (HOSPITAL_COMMUNITY)
Admission: EM | Admit: 2018-09-08 | Discharge: 2018-09-08 | Disposition: A | Discharge status: Home / Self Care | Payer: Self-pay | Attending: Emergency Medicine | Admitting: Emergency Medicine

## 2018-09-08 DIAGNOSIS — R369 Urethral discharge, unspecified: Secondary | ICD-10-CM | POA: Insufficient documentation

## 2018-09-08 DIAGNOSIS — Z87891 Personal history of nicotine dependence: Secondary | ICD-10-CM | POA: Insufficient documentation

## 2018-09-08 DIAGNOSIS — E119 Type 2 diabetes mellitus without complications: Secondary | ICD-10-CM | POA: Insufficient documentation

## 2018-09-08 LAB — URINALYSIS, ROUTINE W REFLEX MICROSCOPIC
Bilirubin Urine: NEGATIVE
Glucose, UA: NEGATIVE mg/dL
HGB URINE DIPSTICK: NEGATIVE
Ketones, ur: NEGATIVE mg/dL
Leukocytes, UA: NEGATIVE
NITRITE: NEGATIVE
Protein, ur: NEGATIVE mg/dL
SPECIFIC GRAVITY, URINE: 1.025 (ref 1.005–1.030)
pH: 6 (ref 5.0–8.0)

## 2018-09-08 MED ORDER — STERILE WATER FOR INJECTION IJ SOLN
INTRAMUSCULAR | Status: AC
  Administered 2018-09-08: 2.1 mL
  Filled 2018-09-08: qty 10

## 2018-09-08 MED ORDER — CEFTRIAXONE SODIUM 250 MG IJ SOLR
250.0000 mg | Freq: Once | INTRAMUSCULAR | Status: AC
  Administered 2018-09-08: 250 mg via INTRAMUSCULAR
  Filled 2018-09-08: qty 250

## 2018-09-08 MED ORDER — AZITHROMYCIN 250 MG PO TABS
1000.0000 mg | ORAL_TABLET | Freq: Once | ORAL | Status: AC
  Administered 2018-09-08: 1000 mg via ORAL
  Filled 2018-09-08: qty 4

## 2018-09-08 NOTE — Discharge Instructions (Addendum)
You were seen in the ER for penile discharge.  I suspect this is from a sexually transmitted disease such as gonorrhea or chlamydia.  Your treated today for both.  Results return to the next 2 to 3 days, you will be called if they are positive.  Recommend condom use with all and any sexual encounters to prevent repeat sexual infections.  Notify all your sexual partners of your symptoms as they need to be evaluated, tested and treated.  Do not have sexual encounters for the next 7 days.  Return to the ER for worsening symptoms, blood in your urine, testicular pain or swelling, abdominal pain, fevers, skin lesions or rash.

## 2018-09-08 NOTE — ED Provider Notes (Signed)
[facility]-EMERG[facility] DEPT Provider Note   CSN: [id number] Arrival date & time: 09/08/18  1911     History   Chief Complaint Chief Complaint  Patient presents with  . Penile Discharge    HPI Cody Navarro is a 22 y.o. male is here for evaluation of penile discharge.  Onset this morning.  Yellow in color.  Associated with dysuria.  States he had unprotected sexual encounter with his spouse last night.  States that his wife had not taken a shower and so he thinks that this may be the cause.  States he is only sexually active with his spouse.  His spouse is not having any symptoms.  Denies sore throat, abdominal pain, blood in his urine, testicular pain or swelling, rectal discomfort or drainage, genital lesions.  Previous history of STDs.   HPI  Past Medical History:  Diagnosis Date  . Asthma   . Diabetes mellitus without complication (HCC)     There are no active problems to display for this patient.   History reviewed. No pertinent surgical history.      Home Medications    Prior to Admission medications   Not on File    Family History Family History  Problem Relation Age of Onset  . Diabetes Other     Social History Social History   Tobacco Use  . Smoking status: Former Smoker    Packs/day: 0.01    Years: 0.20    Pack years: 0.00    Types: Cigarettes  . Smokeless tobacco: Never Used  Substance Use Topics  . Alcohol use: Yes  . Drug use: No     Allergies   Banana and Food   Review of Systems Review of Systems  Genitourinary: Positive for discharge.  All other systems reviewed and are negative.    Physical Exam Updated Vital Signs BP 122/84 (BP Location: Left Arm)   Pulse 65   Temp 97.8 F (36.6 C) (Oral)   Resp 18   SpO2 99%   Physical Exam  Constitutional: He is oriented to person, place, and time. He appears well-developed and well-nourished. No distress.  NAD.  HENT:  Head: Normocephalic and atraumatic.    Right Ear: External ear normal.  Left Ear: External ear normal.  Nose: Nose normal.  Eyes: Conjunctivae and EOM are normal. No scleral icterus.  Neck: Normal range of motion. Neck supple.  Cardiovascular: Normal rate, regular rhythm, normal heart sounds and intact distal pulses.  Pulmonary/Chest: Effort normal and breath sounds normal.  Genitourinary: Discharge found.  Genitourinary Comments: External genitalia normal without erythema, edema, tenderness or lesions.  Circumcised male.  No groin lymphadenopathy.  Scant clear meatus discharge.  Glans and shaft smooth without tenderness, lesions, masses or deformity.  Scrotum without lesions or edema.  Non tender testicles. Epididymis and spermatic cord without tenderness or masses, bilaterally. Cremasteric reflex intact.  Musculoskeletal: Normal range of motion. He exhibits no deformity.  Neurological: He is alert and oriented to person, place, and time.  Skin: Skin is warm and dry. Capillary refill takes less than 2 seconds.  Psychiatric: He has a normal mood and affect. His behavior is normal. Judgment and thought content normal.  Nursing note and vitals reviewed.    ED Treatments / Results  Labs (all labs ordered are listed, but only abnormal results are displayed) Labs Reviewed  URINALYSIS, ROUTINE W REFLEX MICROSCOPIC  GC/CHLAMYDIA PROBE AMP ([place]) NOT AT [facility]    EKG None  Radiology No results found.  Procedures Procedures (including critical care time)  Medications Ordered in ED Medications  cefTRIAXone (ROCEPHIN) injection 250 mg (250 mg Intramuscular Given 09/08/18 2138)  azithromycin (ZITHROMAX) tablet 1,000 mg (1,000 mg Oral Given 09/08/18 2137)  sterile water (preservative free) injection (2.1 mLs  Given 09/08/18 2138)     Initial Impression / Assessment and Plan / ED Course  I have reviewed the triage vital signs and the nursing notes.  Pertinent labs & imaging results that were available during my  care of the patient were reviewed by me and considered in my medical decision making (see chart for details).      22 year old male is here with penile discharge.  Otherwise asymptomatic.  On exam he has scant urethral discharge but no lesions, testicular tenderness or swelling.  No abdominal tenderness.  Previous history of STDs and high risk sexual practices.  Urinalysis negative.  GC/chlamydia swabs sent.  Empirically treated with Rocephin and azithromycin. Provided STD education.  Encouraged pt to notify partners for testing and treatment.  Return precautions given.    Final Clinical Impressions(s) / ED Diagnoses   Final diagnoses:  Penile discharge    ED Discharge Orders    None       Jerrell Mylar 09/08/18 2322    Lorre Nick, MD 09/08/18 [phone]

## 2018-09-09 LAB — GC/CHLAMYDIA PROBE AMP (~~LOC~~) NOT AT ARMC
Chlamydia: POSITIVE — AB
NEISSERIA GONORRHEA: NEGATIVE

## 2018-09-11 ENCOUNTER — Other Ambulatory Visit: Payer: Self-pay

## 2018-09-11 ENCOUNTER — Encounter (HOSPITAL_COMMUNITY): Payer: Self-pay | Admitting: Emergency Medicine

## 2018-09-11 ENCOUNTER — Emergency Department (HOSPITAL_COMMUNITY): Payer: Self-pay

## 2018-09-11 ENCOUNTER — Emergency Department (HOSPITAL_COMMUNITY)
Admission: EM | Admit: 2018-09-11 | Discharge: 2018-09-11 | Disposition: A | Discharge status: Home / Self Care | Payer: Self-pay | Attending: Emergency Medicine | Admitting: Emergency Medicine

## 2018-09-11 DIAGNOSIS — Z87891 Personal history of nicotine dependence: Secondary | ICD-10-CM | POA: Insufficient documentation

## 2018-09-11 DIAGNOSIS — R091 Pleurisy: Secondary | ICD-10-CM | POA: Insufficient documentation

## 2018-09-11 DIAGNOSIS — R0602 Shortness of breath: Secondary | ICD-10-CM

## 2018-09-11 DIAGNOSIS — E119 Type 2 diabetes mellitus without complications: Secondary | ICD-10-CM | POA: Insufficient documentation

## 2018-09-11 DIAGNOSIS — R0789 Other chest pain: Secondary | ICD-10-CM | POA: Insufficient documentation

## 2018-09-11 LAB — D-DIMER, QUANTITATIVE (NOT AT ARMC): D DIMER QUANT: 0.28 ug{FEU}/mL (ref 0.00–0.50)

## 2018-09-11 LAB — BASIC METABOLIC PANEL
Anion gap: 8 (ref 5–15)
BUN: 13 mg/dL (ref 6–20)
CHLORIDE: 107 mmol/L (ref 98–111)
CO2: 27 mmol/L (ref 22–32)
CREATININE: 1.04 mg/dL (ref 0.61–1.24)
Calcium: 9.1 mg/dL (ref 8.9–10.3)
GFR calc Af Amer: >60 mL/min (ref >60)
Glucose, Bld: 93 mg/dL (ref 70–99)
Potassium: 3.8 mmol/L (ref 3.5–5.1)
Sodium: 142 mmol/L (ref 135–145)

## 2018-09-11 LAB — CBC WITH DIFFERENTIAL/PLATELET
Basophils Absolute: 0 10*3/uL (ref 0.0–0.1)
Basophils Relative: 1 %
EOS PCT: 1 %
Eosinophils Absolute: 0.1 10*3/uL (ref 0.0–0.7)
HCT: 42.6 % (ref 39.0–52.0)
Hemoglobin: 14.4 g/dL (ref 13.0–17.0)
LYMPHS ABS: 2.7 10*3/uL (ref 0.7–4.0)
Lymphocytes Relative: 49 %
MCH: 31.2 pg (ref 26.0–34.0)
MCHC: 33.8 g/dL (ref 30.0–36.0)
MCV: 92.4 fL (ref 78.0–100.0)
MONO ABS: 0.5 10*3/uL (ref 0.1–1.0)
MONOS PCT: 8 %
Neutro Abs: 2.3 10*3/uL (ref 1.7–7.7)
Neutrophils Relative %: 41 %
PLATELETS: 209 10*3/uL (ref 150–400)
RBC: 4.61 MIL/uL (ref 4.22–5.81)
RDW: 11.5 % (ref 11.5–15.5)
WBC: 5.5 10*3/uL (ref 4.0–10.5)

## 2018-09-11 LAB — TROPONIN I: Troponin I: <0.03 ng/mL (ref <0.03)

## 2018-09-11 MED ORDER — NAPROXEN 500 MG PO TABS: 500.0000 mg | ORAL_TABLET | Freq: Two times a day (BID) | ORAL | 0 refills | Status: AC

## 2018-09-11 MED ORDER — ALBUTEROL SULFATE (2.5 MG/3ML) 0.083% IN NEBU
5.0000 mg | INHALATION_SOLUTION | Freq: Once | RESPIRATORY_TRACT | Status: AC
  Administered 2018-09-11: 5 mg via RESPIRATORY_TRACT
  Filled 2018-09-11: qty 6

## 2018-09-11 NOTE — ED Provider Notes (Signed)
[facility]-EMER[facility] DEPT Provider Note   CSN: [id number] Arrival date & time: 09/11/18  4098     History   Chief Complaint Chief Complaint  Patient presents with  . Shortness of Breath    HPI Cody Navarro is a 22 y.o. male.  22 y/o male with a PMH of asthma presents to the ED with a chief complaint of shortness of breath and chest pain that began last night at 11pm.Patient works at [employer] and states he was unloading boxes last night when he was standing up and began to feel short of breath.Today he reports shortness of breath along with chest pain.He describes the pain as a constant sharp chest pain with no radiation. The pain is worst with taking a deep breath.Patient has tried no medical therapy but states he tried to poop to relieve his symptoms. He also reports he does not have an inhaler at home and has not had one for a while. He reports a previous smoking history but has now stopped smoking. He denies any fever, sick contact, abdominal pain, or any recent hospitalization for asthma exacerbation. Patient denies any history of blood clots, or family history of DVT/PE.      Past Medical History:  Diagnosis Date  . Asthma   . Diabetes mellitus without complication (HCC)     There are no active problems to display for this patient.   History reviewed. No pertinent surgical history.      Home Medications    Prior to Admission medications   Medication Sig Start Date End Date Taking? Authorizing Provider  naproxen (NAPROSYN) 500 MG tablet Take 1 tablet (500 mg total) by mouth 2 (two) times daily for 7 days. 09/11/18 09/18/18  Claude Manges, PA-C    Family History Family History  Problem Relation Age of Onset  . Diabetes Other     Social History Social History   Tobacco Use  . Smoking status: Former Smoker    Packs/day: 0.01    Years: 0.20    Pack years: 0.00    Types: Cigarettes  . Smokeless tobacco: Never Used  Substance Use  Topics  . Alcohol use: Yes  . Drug use: No     Allergies   Banana; Food; and [employer] allergy]   Review of Systems Review of Systems  Constitutional: Negative for chills and fever.  HENT: Negative for sore throat.   Respiratory: Positive for shortness of breath.   Cardiovascular: Positive for chest pain. Negative for palpitations.  Gastrointestinal: Negative for abdominal pain, nausea and vomiting.  Genitourinary: Negative for dysuria and flank pain.  Musculoskeletal: Negative for back pain and neck pain.  All other systems reviewed and are negative.    Physical Exam Updated Vital Signs BP 126/87 (BP Location: Right Arm)   Pulse 66   Temp 97.9 F (36.6 C) (Oral)   Resp 18   Ht 5\' 11"  (1.803 m)   SpO2 99%   BMI 22.85 kg/m   Physical Exam  Constitutional: He is oriented to person, place, and time. He appears well-developed and well-nourished. No distress.  Non-ill appearing,not in distress  HENT:  Head: Normocephalic and atraumatic.  Cardiovascular: Normal heart sounds.  Pulmonary/Chest: Effort normal. No respiratory distress. He has decreased breath sounds. He has no wheezes. Chest wall is not dull to percussion. He exhibits no bony tenderness.  Breath sounds diminished throughout all lung fields. No wheezing, rhonchi, or rales present.   Abdominal: Soft. Bowel sounds are normal. He exhibits  no distension. There is no tenderness.  Musculoskeletal:       Right lower leg: He exhibits no edema.       Left lower leg: He exhibits no edema.  Neurological: He is alert and oriented to person, place, and time.  Skin: Skin is warm and dry. He is not diaphoretic.  Nursing note and vitals reviewed.    ED Treatments / Results  Labs (all labs ordered are listed, but only abnormal results are displayed) Labs Reviewed  CBC WITH DIFFERENTIAL/PLATELET  BASIC METABOLIC PANEL  D-DIMER, QUANTITATIVE (NOT AT [facility])  TROPONIN I    EKG EKG  Interpretation  Date/Time:  Thursday September 11 2018 08:58:36 EDT Ventricular Rate:  58 PR Interval:    QRS Duration: 99 QT Interval:  408 QTC Calculation: 401 R Axis:   58 Text Interpretation:  Sinus rhythm Atrial premature complex Nonspecific T abnormalities, lateral leads ST elev, probable normal early repol pattern hyperacute T wave Confirmed by Derwood Kaplan [phone]) on 09/11/2018 9:38:28 AM Also confirmed by Derwood Kaplan [phone]), editor Sheppard Evens ([id number])  on 09/11/2018 10:08:23 AM   Radiology Dg Chest 2 View  Result Date: 09/11/2018 CLINICAL DATA:  22 year old male with a history of shortness of breath EXAM: CHEST - 2 VIEW COMPARISON:  04/22/2016 FINDINGS: Cardiomediastinal silhouette within normal limits in size and contour, unchanged. No evidence of confluent airspace disease. No pneumothorax or pleural effusion. Scoliotic curvature of the thoracic spine is unchanged. No displaced fracture. IMPRESSION: Negative for acute cardiopulmonary disease. Electronically Signed   By: Gilmer Mor D.O.   On: 09/11/2018 07:57    Procedures Procedures (including critical care time)  Medications Ordered in ED Medications  albuterol (PROVENTIL) (2.5 MG/3ML) 0.083% nebulizer solution 5 mg (5 mg Nebulization Given 09/11/18 0754)     Initial Impression / Assessment and Plan / ED Course  I have reviewed the triage vital signs and the nursing notes.  Pertinent labs & imaging results that were available during my care of the patient were reviewed by me and considered in my medical decision making (see chart for details).     8:13 AM receiving breathing treatment at this time will come back and return to check on patient after breathing treatment has been completed. Presents with shortness of breath which began last night when he was unloading boxes at work.  He reports he has tried no medical therapy for his relieving symptoms and states the pain is constant and now he is experiencing  some chest pain as well.  Scribes a shortness of breath is worse when he takes a deep breath and mainly located on his left chest.  She received one breathing treatment and states he does not feel better, due to patient's complaint of chest pain will order EKG to rule out any cardiac abnormality.  Patient is currently satting at 97% on room air and he appears not in distress, not ill apparent appearing.  Patient is PERC negative at this time. DG Chest showed no pneumothorax or pleural effusion. No evidence of confluent airspace disease. Negative for acute cardiopulmonary disease. I have discussed this patient with Dr. Rhunette Croft who will see and evaluate patient.  CBC,CMP order along with D-dimer to r/o any PE.  D dimer was negative. Troponin was 0.03, negative.I believe patient is less likely suffering from ACS as he does not have any previous history of or family history.   Patient pain is not positional, I believe this is less likely to be pericarditis. Patient's EKG  showed no STEMI. Patient likely suffering from pleurisy. He will be discharge home with NSAIDS. Return precautions provided. Patients vitals stable, vitals stable for discharge.   Final Clinical Impressions(s) / ED Diagnoses   Final diagnoses:  Pleurisy  SOB (shortness of breath)    ED Discharge Orders         Ordered    naproxen (NAPROSYN) 500 MG tablet  2 times daily     09/11/18 1156           Claude Manges, PA-C 09/11/18 1158    Derwood Kaplan, MD 09/12/18 1639

## 2018-09-11 NOTE — ED Triage Notes (Signed)
Pt presents with shortness of breath that is recurrent with some dizziness and light headedness. Patient states it began while he was at work around 2300 last night and has gotten gradually worse. Patient complaining of sharp epigastric pain when he breathes. Patient well appearing but having difficulty talking for longer periods of time.

## 2018-09-11 NOTE — Discharge Instructions (Signed)
I have prescribed medication for your pain, please take as directed.If you experience any worsening chest pain, diaphoresis, shortness of breath please return to the ED for reevaluation.

## 2018-09-21 ENCOUNTER — Encounter (HOSPITAL_COMMUNITY): Payer: Self-pay | Admitting: Emergency Medicine

## 2018-09-21 ENCOUNTER — Emergency Department (HOSPITAL_COMMUNITY): Payer: Self-pay

## 2018-09-21 ENCOUNTER — Encounter (HOSPITAL_COMMUNITY): Payer: Self-pay | Admitting: *Deleted

## 2018-09-21 ENCOUNTER — Inpatient Hospital Stay (HOSPITAL_COMMUNITY)
Admission: RE | Admit: 2018-09-21 | Discharge: 2018-09-23 | DRG: 881 | Disposition: A | Discharge status: Home / Self Care | Payer: Federal, State, Local not specified - Other | Attending: Psychiatry | Admitting: Psychiatry

## 2018-09-21 ENCOUNTER — Other Ambulatory Visit: Payer: Self-pay

## 2018-09-21 ENCOUNTER — Emergency Department (HOSPITAL_COMMUNITY)
Admission: EM | Admit: 2018-09-21 | Discharge: 2018-09-21 | Disposition: A | Discharge status: Home / Self Care | Payer: Self-pay | Attending: Emergency Medicine | Admitting: Emergency Medicine

## 2018-09-21 DIAGNOSIS — F1010 Alcohol abuse, uncomplicated: Secondary | ICD-10-CM | POA: Diagnosis present

## 2018-09-21 DIAGNOSIS — Y9389 Activity, other specified: Secondary | ICD-10-CM | POA: Insufficient documentation

## 2018-09-21 DIAGNOSIS — Z87891 Personal history of nicotine dependence: Secondary | ICD-10-CM | POA: Diagnosis not present

## 2018-09-21 DIAGNOSIS — E119 Type 2 diabetes mellitus without complications: Secondary | ICD-10-CM | POA: Diagnosis present

## 2018-09-21 DIAGNOSIS — Z91018 Allergy to other foods: Secondary | ICD-10-CM | POA: Diagnosis not present

## 2018-09-21 DIAGNOSIS — Z833 Family history of diabetes mellitus: Secondary | ICD-10-CM

## 2018-09-21 DIAGNOSIS — S61216A Laceration without foreign body of right little finger without damage to nail, initial encounter: Secondary | ICD-10-CM | POA: Insufficient documentation

## 2018-09-21 DIAGNOSIS — Y999 Unspecified external cause status: Secondary | ICD-10-CM | POA: Insufficient documentation

## 2018-09-21 DIAGNOSIS — Z23 Encounter for immunization: Secondary | ICD-10-CM | POA: Insufficient documentation

## 2018-09-21 DIAGNOSIS — J45909 Unspecified asthma, uncomplicated: Secondary | ICD-10-CM | POA: Diagnosis present

## 2018-09-21 DIAGNOSIS — G4700 Insomnia, unspecified: Secondary | ICD-10-CM | POA: Diagnosis not present

## 2018-09-21 DIAGNOSIS — Z733 Stress, not elsewhere classified: Secondary | ICD-10-CM | POA: Insufficient documentation

## 2018-09-21 DIAGNOSIS — F332 Major depressive disorder, recurrent severe without psychotic features: Secondary | ICD-10-CM | POA: Diagnosis not present

## 2018-09-21 DIAGNOSIS — F329 Major depressive disorder, single episode, unspecified: Secondary | ICD-10-CM | POA: Diagnosis present

## 2018-09-21 DIAGNOSIS — F322 Major depressive disorder, single episode, severe without psychotic features: Secondary | ICD-10-CM | POA: Diagnosis not present

## 2018-09-21 DIAGNOSIS — Y929 Unspecified place or not applicable: Secondary | ICD-10-CM | POA: Insufficient documentation

## 2018-09-21 DIAGNOSIS — Z91013 Allergy to seafood: Secondary | ICD-10-CM

## 2018-09-21 DIAGNOSIS — F419 Anxiety disorder, unspecified: Secondary | ICD-10-CM | POA: Diagnosis not present

## 2018-09-21 DIAGNOSIS — W25XXXA Contact with sharp glass, initial encounter: Secondary | ICD-10-CM | POA: Insufficient documentation

## 2018-09-21 MED ORDER — MAGNESIUM HYDROXIDE 400 MG/5ML PO SUSP: 30.0000 mL | Freq: Every day | ORAL | Status: DC | PRN

## 2018-09-21 MED ORDER — LORAZEPAM 1 MG PO TABS
1.0000 mg | ORAL_TABLET | Freq: Four times a day (QID) | ORAL | Status: DC | PRN
  Administered 2018-09-21: 1 mg via ORAL
  Filled 2018-09-21: qty 1

## 2018-09-21 MED ORDER — CITALOPRAM HYDROBROMIDE 10 MG PO TABS
10.0000 mg | ORAL_TABLET | Freq: Every day | ORAL | Status: DC
  Administered 2018-09-21 – 2018-09-22 (×2): 10 mg via ORAL
  Filled 2018-09-21 (×6): qty 1

## 2018-09-21 MED ORDER — ADULT MULTIVITAMIN W/MINERALS CH
1.0000 | ORAL_TABLET | Freq: Every day | ORAL | Status: DC
  Administered 2018-09-21 – 2018-09-23 (×3): 1 via ORAL
  Filled 2018-09-21 (×5): qty 1

## 2018-09-21 MED ORDER — LIDOCAINE HCL (PF) 1 % IJ SOLN
5.0000 mL | Freq: Once | INTRAMUSCULAR | Status: AC
  Administered 2018-09-21: 5 mL via INTRADERMAL
  Filled 2018-09-21: qty 30

## 2018-09-21 MED ORDER — TRAZODONE HCL 50 MG PO TABS
50.0000 mg | ORAL_TABLET | Freq: Every evening | ORAL | Status: DC | PRN
  Administered 2018-09-21 – 2018-09-22 (×2): 50 mg via ORAL
  Filled 2018-09-21: qty 7
  Filled 2018-09-21 (×2): qty 1

## 2018-09-21 MED ORDER — THIAMINE HCL 100 MG/ML IJ SOLN
100.0000 mg | Freq: Once | INTRAMUSCULAR | Status: AC
  Administered 2018-09-21: 100 mg via INTRAMUSCULAR
  Filled 2018-09-21: qty 2

## 2018-09-21 MED ORDER — ONDANSETRON 4 MG PO TBDP: 4.0000 mg | ORAL_TABLET | Freq: Four times a day (QID) | ORAL | Status: DC | PRN

## 2018-09-21 MED ORDER — ACETAMINOPHEN 325 MG PO TABS: 650.0000 mg | ORAL_TABLET | Freq: Four times a day (QID) | ORAL | Status: DC | PRN

## 2018-09-21 MED ORDER — LOPERAMIDE HCL 2 MG PO CAPS: 2.0000 mg | ORAL_CAPSULE | ORAL | Status: DC | PRN

## 2018-09-21 MED ORDER — HYDROXYZINE HCL 25 MG PO TABS
25.0000 mg | ORAL_TABLET | Freq: Three times a day (TID) | ORAL | Status: DC | PRN
  Administered 2018-09-21: 25 mg via ORAL
  Filled 2018-09-21 (×2): qty 1

## 2018-09-21 MED ORDER — ALUM & MAG HYDROXIDE-SIMETH 200-200-20 MG/5ML PO SUSP: 30.0000 mL | ORAL | Status: DC | PRN

## 2018-09-21 MED ORDER — HYDROXYZINE HCL 25 MG PO TABS
25.0000 mg | ORAL_TABLET | Freq: Four times a day (QID) | ORAL | Status: DC | PRN
  Filled 2018-09-21: qty 10

## 2018-09-21 MED ORDER — TETANUS-DIPHTH-ACELL PERTUSSIS 5-2.5-18.5 LF-MCG/0.5 IM SUSP
0.5000 mL | Freq: Once | INTRAMUSCULAR | Status: AC
  Administered 2018-09-21: 0.5 mL via INTRAMUSCULAR
  Filled 2018-09-21: qty 0.5

## 2018-09-21 MED ORDER — INFLUENZA VAC SPLIT QUAD 0.5 ML IM SUSY
0.5000 mL | PREFILLED_SYRINGE | INTRAMUSCULAR | Status: AC
  Administered 2018-09-22: 0.5 mL via INTRAMUSCULAR
  Filled 2018-09-21: qty 0.5

## 2018-09-21 MED ORDER — VITAMIN B-1 100 MG PO TABS
100.0000 mg | ORAL_TABLET | Freq: Every day | ORAL | Status: DC
  Administered 2018-09-22 – 2018-09-23 (×2): 100 mg via ORAL
  Filled 2018-09-21 (×4): qty 1

## 2018-09-21 NOTE — ED Triage Notes (Signed)
Pt reports to punching a window with right hand due to stress. Active bleeding to right pinky finger at knuckle. Bleeding controlled in triage.
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
## 2018-09-21 NOTE — Tx Team (Signed)
Initial Treatment Plan 09/21/2018 3:55 PM [name] [id number]    PATIENT STRESSORS: Loss of relationship   PATIENT STRENGTHS: Ability for insight Average or above average intelligence Capable of independent living General fund of knowledge Motivation for treatment/growth Physical Health   PATIENT IDENTIFIED PROBLEMS: Depression Suicidal thoughts "I'm feeling guilty about my last relationship"                     DISCHARGE CRITERIA:  Ability to meet basic life and health needs Improved stabilization in mood, thinking, and/or behavior Verbal commitment to aftercare and medication compliance  PRELIMINARY DISCHARGE PLAN: Attend aftercare/continuing care group Return to previous living arrangement  PATIENT/FAMILY INVOLVEMENT: This treatment plan has been presented to and reviewed with the patient, Cody Navarro, and/or family member, .  The patient and family have been given the opportunity to ask questions and make suggestions.  Cornie Herrington, [place], [place] 09/21/2018, 3:55 PM
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
## 2018-09-21 NOTE — [facility] Suicide Risk Assessment (Signed)
[facility] Admission Suicide Risk Assessment   Nursing information obtained from:   patient and chart Demographic factors:   22 year old single male , lives with brother , employed  Current Mental Status:   see below Loss Factors:   broken relationship Historical Factors:   history of depression, history of alcohol use disorder Risk Reduction Factors:   resilience   Total Time spent with patient: 45 minutes Principal Problem:  MDD versus Adjustment Disorder, Alcohol Use Disorder  Diagnosis:   Patient Active Problem List   Diagnosis Date Noted  . MDD (major depressive disorder), severe (HCC) [F32.2] 09/21/2018   Subjective Data:   Continued Clinical Symptoms:    The "Alcohol Use Disorders Identification Test", Guidelines for Use in Primary Care, Second Edition.  World Science writer [facility]). Score between 0-7:  no or low risk or alcohol related problems. Score between 8-15:  moderate risk of alcohol related problems. Score between 16-19:  high risk of alcohol related problems. Score 20 or above:  warrants further diagnostic evaluation for alcohol dependence and treatment.   CLINICAL FACTORS:  22 year old single male, employed,lives with brother. Presented voluntarily to [facility] reporting acute depression, passive SI. Had gone to ED yesterday after punching a car window, requiring sutures .  Describes worsening symptoms as acute and denies persistent depression or recent suicidal ideations.  Attributes symptoms to recent break up and inability to reach ex GF to speak with her and obtain a sense of closure . Reports daily alcohol consumption, between 3-5 shots of liquor daily .   Psychiatric Specialty Exam: Physical Exam  ROS  Blood pressure 123/71, pulse 82, temperature 98.3 F (36.8 C), temperature source Oral, resp. rate 18, height 5\' 11"  (1.803 m), weight 69.9 kg, SpO2 100 %.Body mass index is 21.48 kg/m.  See admit note MSE   COGNITIVE FEATURES THAT CONTRIBUTE TO RISK:   Closed-mindedness and Loss of executive function    SUICIDE RISK:   Moderate:  Frequent suicidal ideation with limited intensity, and duration, some specificity in terms of plans, no associated intent, good self-control, limited dysphoria/symptomatology, some risk factors present, and identifiable protective factors, including available and accessible social support.  PLAN OF CARE: Patient will be admitted to inpatient psychiatric unit for stabilization and safety. Will provide and encourage milieu participation. Provide medication management and maked adjustments as needed.  Will also provide medication management to minimize risk of ETOH WDL. Will follow daily.    I certify that inpatient services furnished can reasonably be expected to improve the patient's condition.   Craige Cotta, MD 09/21/2018, 3:28 PM

## 2018-09-21 NOTE — Progress Notes (Signed)
Cody Navarro is a 22 year old male pt admitted on a voluntary basis after presenting as a walk-in. On admission, he does endorse that he has been feeling depressed and suicidal but is able to contract for safety in the hospital. He spoke about how he has been having difficulties with a relationship with his ex-girlfriend. He spoke about how he has been feeling guilty because he cheated on her with another lady. He reports that he is not on any medications currently and reports that he has never been on any. He does endorse alcohol consumption and occasional marijuana usage. He reports that he sees a therapist but reports that it is through is employer [employer] and reports that he would like to get someone else to be able to talk to. He reports that he lives with his brother and reports that he will return to the same living situation upon discharge. [name] was escorted to the unit, oriented to the milieu and safety maintained.

## 2018-09-21 NOTE — Discharge Instructions (Signed)
Keep sutures clean and dry. Follow-up with urgent care in 1 week for suture removal. Return to the ED for new or worsening symptoms.
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
## 2018-09-21 NOTE — Progress Notes (Signed)
D.  Pt in bed on approach, new to unit.  Pt denied complaints at this time.  Pt returned to bed shortly after med pass, denies SI/HI/AVH at this time.  A.  Support and encouragement offered, medication given as ordered  R.  Pt remains safe on the unit, will continue to monitor.

## 2018-09-21 NOTE — ED Provider Notes (Signed)
[facility]-EMERGENCY DEPT Provider Note   CSN: [id number] Arrival date & time: 09/21/18  4098     History   Chief Complaint Chief Complaint  Patient presents with  . Laceration    HPI Cody Navarro is a 22 y.o. male.  The history is provided by the patient and medical records.  Laceration       55 y.o. M here with right 5th digit laceration.  Friends report he has been stressed out from working too much overtime lately, apparently got upset and punched a window.  Has laceration to right 5th digit. Some bleeding on arrival, controlled with bandage.  Date of last tetanus unknown.  Past Medical History:  Diagnosis Date  . Asthma   . Diabetes mellitus without complication (HCC)     There are no active problems to display for this patient.   History reviewed. No pertinent surgical history.      Home Medications    Prior to Admission medications   Not on File    Family History Family History  Problem Relation Age of Onset  . Diabetes Other     Social History Social History   Tobacco Use  . Smoking status: Former Smoker    Packs/day: 0.01    Years: 0.20    Pack years: 0.00    Types: Cigarettes  . Smokeless tobacco: Never Used  Substance Use Topics  . Alcohol use: Yes  . Drug use: No     Allergies   Banana; Food; and [employer] allergy]   Review of Systems Review of Systems  Skin: Positive for wound.  All other systems reviewed and are negative.    Physical Exam Updated Vital Signs BP 120/83 (BP Location: Left Arm)   Pulse 67   Temp 98.2 F (36.8 C) (Oral)   Resp 16   Ht 5\' 11"  (1.803 m)   Wt 70.3 kg   SpO2 99%   BMI 21.62 kg/m   Physical Exam  Constitutional: He is oriented to person, place, and time. He appears well-developed and well-nourished.  HENT:  Head: Normocephalic and atraumatic.  Mouth/Throat: Oropharynx is clear and moist.  Eyes: Pupils are equal, round, and reactive to light. Conjunctivae  and EOM are normal.  Neck: Normal range of motion.  Cardiovascular: Normal rate, regular rhythm and normal heart sounds.  Pulmonary/Chest: Effort normal and breath sounds normal.  Abdominal: Soft. Bowel sounds are normal.  Musculoskeletal: Normal range of motion.  Right 5th digit with 1cm laceration over the IP joint; mild bleeding noted; no acute bony deformities of the finger or hand Small abrasion to right 4th digit without bleeding or deformity  Neurological: He is alert and oriented to person, place, and time.  Skin: Skin is warm and dry.  Psychiatric: He has a normal mood and affect.  Nursing note and vitals reviewed.    ED Treatments / Results  Labs (all labs ordered are listed, but only abnormal results are displayed) Labs Reviewed - No data to display  EKG None  Radiology No results found.  Procedures Procedures (including critical care time)  LACERATION REPAIR Performed by: Garlon Hatchet Authorized by: Garlon Hatchet Consent: Verbal consent obtained. Risks and benefits: risks, benefits and alternatives were discussed Consent given by: patient Patient identity confirmed: provided demographic data Prepped and Draped in normal sterile fashion Wound explored  Laceration Location: right 5th digit, IP joint  Laceration Length: 1 cm  No Foreign Bodies seen or palpated  Anesthesia: local infiltration  Local anesthetic: lidocaine 1% without epinephrine  Anesthetic total: 3 ml  Irrigation method: syringe Amount of cleaning: standard  Skin closure: 5-0 prolene  Number of sutures: 2  Technique: simple interrupted  Patient tolerance: Patient tolerated the procedure well with no immediate complications.   Medications Ordered in ED Medications  Tdap (BOOSTRIX) injection 0.5 mL (0.5 mLs Intramuscular Given 09/21/18 0523)  lidocaine (PF) (XYLOCAINE) 1 % injection 5 mL (5 mLs Intradermal Given 09/21/18 0523)     Initial Impression / Assessment and Plan /  ED Course  I have reviewed the triage vital signs and the nursing notes.  Pertinent labs & imaging results that were available during my care of the patient were reviewed by me and considered in my medical decision making (see chart for details).  22 year old male here with laceration to right fifth digit.  Apparently punched a window.  Roommates report he has been "stressed".  Has 1 cm laceration to right fifth digit along the IP joint.  No obvious bony deformity or exposed tendon.  X-ray negative.  Tetanus updated.  Laceration repaired as above, patient tolerated well.  Discharge home with wound care instructions.  Suture removal in 1 week at urgent care.  Return here for any new/acute changes.  Final Clinical Impressions(s) / ED Diagnoses   Final diagnoses:  Laceration of right little finger without foreign body without damage to nail, initial encounter    ED Discharge Orders    None       Garlon Hatchet, PA-C 09/21/18 7829    Nicanor Alcon, April, MD 09/21/18 5621

## 2018-09-21 NOTE — H&P (Signed)
Behavioral Health Medical Screening Exam  Cody Navarro is an 22 y.o. male.  Total Time spent with patient: 20 minutes  Psychiatric Specialty Exam: Physical Exam  Nursing note and vitals reviewed. Constitutional: He is oriented to person, place, and time. He appears well-developed and well-nourished.  Cardiovascular: Normal rate.  Respiratory: Effort normal.  Musculoskeletal: Normal range of motion.  Neurological: He is alert and oriented to person, place, and time.  Skin: Skin is warm.    Review of Systems  Constitutional: Negative.   HENT: Negative.   Eyes: Negative.   Respiratory: Negative.   Cardiovascular: Negative.   Gastrointestinal: Negative.   Genitourinary: Negative.   Musculoskeletal: Negative.   Skin: Negative.   Neurological: Negative.   Endo/Heme/Allergies: Negative.   Psychiatric/Behavioral: Positive for depression, substance abuse and suicidal ideas. The patient is nervous/anxious.     Blood pressure 121/87, pulse 90, temperature 98.2 F (36.8 C), resp. rate 18, SpO2 100 %.There is no height or weight on file to calculate BMI.  General Appearance: Casual  Eye Contact:  Fair  Speech:  Clear and Coherent and Normal Rate  Volume:  Decreased  Mood:  Depressed  Affect:  Flat  Thought Process:  Linear and Descriptions of Associations: Intact  Orientation:  Full (Time, Place, and Person)  Thought Content:  WDL  Suicidal Thoughts:  Yes.  without intent/plan  Homicidal Thoughts:  No  Memory:  Immediate;   Good Recent;   Good Remote;   Good  Judgement:  Fair  Insight:  Fair  Psychomotor Activity:  Normal  Concentration: Concentration: Good and Attention Span: Good  Recall:  Good  Fund of Knowledge:Good  Language: Good  Akathisia:  No  Handed:  Right  AIMS (if indicated):     Assets:  Communication Skills Desire for Improvement Financial Resources/Insurance Housing Physical Health Social Support Transportation  Sleep:        Musculoskeletal: Strength & Muscle Tone: within normal limits Gait & Station: normal Patient leans: N/A  Blood pressure 121/87, pulse 90, temperature 98.2 F (36.8 C), resp. rate 18, SpO2 100 %.  Recommendations:  Based on my evaluation the patient does not appear to have an emergency medical condition.  Gerlene Burdock Money, FNP 09/21/2018, 12:56 PM

## 2018-09-22 DIAGNOSIS — F419 Anxiety disorder, unspecified: Secondary | ICD-10-CM

## 2018-09-22 DIAGNOSIS — F332 Major depressive disorder, recurrent severe without psychotic features: Secondary | ICD-10-CM

## 2018-09-22 DIAGNOSIS — G4700 Insomnia, unspecified: Secondary | ICD-10-CM

## 2018-09-22 LAB — CBC WITH DIFFERENTIAL/PLATELET
BASOS ABS: 0 10*3/uL (ref 0.0–0.1)
BASOS PCT: 0 %
Eosinophils Absolute: 0 10*3/uL (ref 0.0–0.7)
Eosinophils Relative: 0 %
HEMATOCRIT: 46.8 % (ref 39.0–52.0)
HEMOGLOBIN: 15.6 g/dL (ref 13.0–17.0)
LYMPHS PCT: 35 %
Lymphs Abs: 2.1 10*3/uL (ref 0.7–4.0)
MCH: 31.5 pg (ref 26.0–34.0)
MCHC: 33.3 g/dL (ref 30.0–36.0)
MCV: 94.5 fL (ref 78.0–100.0)
MONO ABS: 0.4 10*3/uL (ref 0.1–1.0)
MONOS PCT: 7 %
NEUTROS PCT: 58 %
Neutro Abs: 3.4 10*3/uL (ref 1.7–7.7)
Platelets: 265 10*3/uL (ref 150–400)
RBC: 4.95 MIL/uL (ref 4.22–5.81)
RDW: 11.8 % (ref 11.5–15.5)
WBC: 6 10*3/uL (ref 4.0–10.5)

## 2018-09-22 LAB — RAPID URINE DRUG SCREEN, HOSP PERFORMED
Amphetamines: NOT DETECTED
BARBITURATES: NOT DETECTED
BENZODIAZEPINES: NOT DETECTED
Cocaine: NOT DETECTED
Opiates: NOT DETECTED
Tetrahydrocannabinol: NOT DETECTED

## 2018-09-22 LAB — COMPREHENSIVE METABOLIC PANEL
ALK PHOS: 40 U/L (ref 38–126)
ALT: 21 U/L (ref 0–44)
AST: 32 U/L (ref 15–41)
Albumin: 4.5 g/dL (ref 3.5–5.0)
Anion gap: 12 (ref 5–15)
BUN: 15 mg/dL (ref 6–20)
CALCIUM: 9.6 mg/dL (ref 8.9–10.3)
CHLORIDE: 101 mmol/L (ref 98–111)
CO2: 29 mmol/L (ref 22–32)
CREATININE: 1.23 mg/dL (ref 0.61–1.24)
GFR calc non Af Amer: >60 mL/min (ref >60)
Glucose, Bld: 80 mg/dL (ref 70–99)
Potassium: 4 mmol/L (ref 3.5–5.1)
SODIUM: 142 mmol/L (ref 135–145)
Total Bilirubin: 3.8 mg/dL — ABNORMAL HIGH (ref 0.3–1.2)
Total Protein: 8 g/dL (ref 6.5–8.1)

## 2018-09-22 LAB — URINALYSIS, COMPLETE (UACMP) WITH MICROSCOPIC
Bilirubin Urine: NEGATIVE
GLUCOSE, UA: NEGATIVE mg/dL
Hgb urine dipstick: NEGATIVE
KETONES UR: 5 mg/dL — AB
LEUKOCYTES UA: NEGATIVE
Nitrite: NEGATIVE
PH: 5 (ref 5.0–8.0)
PROTEIN: NEGATIVE mg/dL
Specific Gravity, Urine: 1.029 (ref 1.005–1.030)

## 2018-09-22 LAB — HEMOGLOBIN A1C
Hgb A1c MFr Bld: 4.5 % — ABNORMAL LOW (ref 4.8–5.6)
MEAN PLASMA GLUCOSE: 82.45 mg/dL

## 2018-09-22 LAB — TSH: TSH: 1.064 u[IU]/mL (ref 0.350–4.500)

## 2018-09-22 MED ORDER — CITALOPRAM HYDROBROMIDE 20 MG PO TABS
20.0000 mg | ORAL_TABLET | Freq: Every day | ORAL | Status: DC
  Administered 2018-09-23: 20 mg via ORAL
  Filled 2018-09-22 (×2): qty 1
  Filled 2018-09-22: qty 7
  Filled 2018-09-22: qty 1

## 2018-09-22 NOTE — Plan of Care (Signed)
Nurse discussed anxiety, depression, coping skills with patient. 

## 2018-09-22 NOTE — Progress Notes (Signed)
D:  Patient's self inventory sheet, patient sleeps good, sleep medication helpful.  Fair appetite, normal energy level, good concentration.  Rated depression 2, hopeless 3, denied anxiety.  Withdrawals but did not check any information.  Denied SI.  Denied physical problems.  Denied physical pain.  Goal is keep working on depression.  Plans to overcome feelings and think positive.  Does have discharge plans. A:  Medications administered per MD orders.  Emotional support and encouragement given patient. R:  Denied SI and HI, contracts for safety.  Denied A/V hallucinations.  Safety maintained with 15 minute checks.

## 2018-09-22 NOTE — Progress Notes (Signed)
Patient ID: Cody Navarro, male   DOB: [birth year], 22 y.o.   MRN: [id number]  D: Patient pleasant on approach tonight. Reports that his mood is improved and currently denies any active SI. Contracts for safety on the unit. Talking about possibly being discharged tomorrow. CIWA=0 tonight. No prn needed from alcohol withdrawals. A: Staff will monitor on q 15 minute checks, follow treatment plan, and give medications as ordered. R: Cooperative on the unit. Took sleep medication and got finger wrapped with gauze because he wanted it covered. He has stitches in finger prior to hospitalization.

## 2018-09-22 NOTE — B[facility] Group Notes (Signed)
LCSW Group Therapy Note   09/22/2018 1:15pm   Type of Therapy and Topic:  Group Therapy:  Overcoming Obstacles   Participation Level:  Did Not Attend--pt invited. Chose to remain in bed.    Description of Group:    In this group patients will be encouraged to explore what they see as obstacles to their own wellness and recovery. They will be guided to discuss their thoughts, feelings, and behaviors related to these obstacles. The group will process together ways to cope with barriers, with attention given to specific choices patients can make. Each patient will be challenged to identify changes they are motivated to make in order to overcome their obstacles. This group will be process-oriented, with patients participating in exploration of their own experiences as well as giving and receiving support and challenge from other group members.   Therapeutic Goals: 1. Patient will identify personal and current obstacles as they relate to admission. 2. Patient will identify barriers that currently interfere with their wellness or overcoming obstacles.  3. Patient will identify feelings, thought process and behaviors related to these barriers. 4. Patient will identify two changes they are willing to make to overcome these obstacles:      Summary of Patient Progress   x   Therapeutic Modalities:   Cognitive Behavioral Therapy Solution Focused Therapy Motivational Interviewing Relapse Prevention Therapy  Rona Ravens, LCSW 09/22/2018 1:07 PM

## 2018-09-22 NOTE — Progress Notes (Signed)
Patient is concerned about his court date scheduled this week.  Patient needs to make sure he is not charged with failure to appear in court.
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
## 2018-09-22 NOTE — Tx Team (Signed)
Interdisciplinary Treatment and Diagnostic Plan Update  09/22/2018 Time of Session: [place] MRN: [id number]  Principal Diagnosis: MDD, recurrent, severe  Secondary Diagnoses: Active Problems:   MDD (major depressive disorder), severe ([facility])   Current Medications:  Current Facility-Administered Medications  Medication Dose Route Frequency Provider Last Rate Last Dose  . acetaminophen (TYLENOL) tablet 650 mg  650 mg Oral Q6H PRN Money, Lowry Ram, FNP      . alum & mag hydroxide-simeth (MAALOX/MYLANTA) 200-200-20 MG/5ML suspension 30 mL  30 mL Oral Q4H PRN Money, Lowry Ram, FNP      . citalopram (CELEXA) tablet 10 mg  10 mg Oral Daily Cobos, Myer Peer, MD   10 mg at 09/22/18 0835  . hydrOXYzine (ATARAX/VISTARIL) tablet 25 mg  25 mg Oral Q6H PRN Money, Lowry Ram, FNP      . hydrOXYzine (ATARAX/VISTARIL) tablet 25 mg  25 mg Oral TID PRN Money, Lowry Ram, FNP   25 mg at 09/21/18 1636  . Influenza vac split quadrivalent PF (FLUARIX) injection 0.5 mL  0.5 mL Intramuscular Tomorrow-1000 Cobos, Fernando A, MD      . loperamide (IMODIUM) capsule 2-4 mg  2-4 mg Oral PRN Money, Lowry Ram, FNP      . LORazepam (ATIVAN) tablet 1 mg  1 mg Oral Q6H PRN Money, Lowry Ram, FNP   1 mg at 09/21/18 2132  . magnesium hydroxide (MILK OF MAGNESIA) suspension 30 mL  30 mL Oral Daily PRN Money, Lowry Ram, FNP      . multivitamin with minerals tablet 1 tablet  1 tablet Oral Daily Money, Lowry Ram, FNP   1 tablet at 09/22/18 0835  . ondansetron (ZOFRAN-ODT) disintegrating tablet 4 mg  4 mg Oral Q6H PRN Money, Lowry Ram, FNP      . thiamine (VITAMIN B-1) tablet 100 mg  100 mg Oral Daily Money, Lowry Ram, FNP   100 mg at 09/22/18 0835  . traZODone (DESYREL) tablet 50 mg  50 mg Oral QHS PRN Money, Lowry Ram, FNP   50 mg at 09/21/18 2132   PTA Medications: No medications prior to admission.    Patient Stressors: Loss of relationship  Patient Strengths: Ability for insight Average or above average  intelligence Capable of independent living General fund of knowledge Motivation for treatment/growth Physical Health  Treatment Modalities: Medication Management, Group therapy, Case management,  1 to 1 session with clinician, Psychoeducation, Recreational therapy.   Physician Treatment Plan for Primary Diagnosis: MDD, recurrent, severe Long Term Goal(s): Improvement in symptoms so as ready for discharge Improvement in symptoms so as ready for discharge   Short Term Goals: Ability to identify changes in lifestyle to reduce recurrence of condition will improve Ability to maintain clinical measurements within normal limits will improve Ability to identify triggers associated with substance abuse/mental health issues will improve  Medication Management: Evaluate patient's response, side effects, and tolerance of medication regimen.  Therapeutic Interventions: 1 to 1 sessions, Unit Group sessions and Medication administration.  Evaluation of Outcomes: Not Met  Physician Treatment Plan for Secondary Diagnosis: Active Problems:   MDD (major depressive disorder), severe ([place])  Long Term Goal(s): Improvement in symptoms so as ready for discharge Improvement in symptoms so as ready for discharge   Short Term Goals: Ability to identify changes in lifestyle to reduce recurrence of condition will improve Ability to maintain clinical measurements within normal limits will improve Ability to identify triggers associated with substance abuse/mental health issues will improve     Medication Management:  Evaluate patient's response, side effects, and tolerance of medication regimen.  Therapeutic Interventions: 1 to 1 sessions, Unit Group sessions and Medication administration.  Evaluation of Outcomes: Not Met   RN Treatment Plan for Primary Diagnosis: MDD, recurrent, severe Long Term Goal(s): Knowledge of disease and therapeutic regimen to maintain health will improve  Short Term Goals:  Ability to remain free from injury will improve, Ability to verbalize frustration and anger appropriately will improve, Ability to demonstrate self-control and Ability to disclose and discuss suicidal ideas  Medication Management: RN will administer medications as ordered by provider, will assess and evaluate patient's response and provide education to patient for prescribed medication. RN will report any adverse and/or side effects to prescribing provider.  Therapeutic Interventions: 1 on 1 counseling sessions, Psychoeducation, Medication administration, Evaluate responses to treatment, Monitor vital signs and CBGs as ordered, Perform/monitor CIWA, COWS, AIMS and Fall Risk screenings as ordered, Perform wound care treatments as ordered.  Evaluation of Outcomes: Not Met   LCSW Treatment Plan for Primary Diagnosis: MDD, recurrent, severe Long Term Goal(s): Safe transition to appropriate next level of care at discharge, Engage patient in therapeutic group addressing interpersonal concerns.  Short Term Goals: Engage patient in aftercare planning with referrals and resources, Facilitate patient progression through stages of change regarding substance use diagnoses and concerns and Identify triggers associated with mental health/substance abuse issues  Therapeutic Interventions: Assess for all discharge needs, 1 to 1 time with Social worker, Explore available resources and support systems, Assess for adequacy in community support network, Educate family and significant other(s) on suicide prevention, Complete Psychosocial Assessment, Interpersonal group therapy.  Evaluation of Outcomes: Not Met   Progress in Treatment: Attending groups: No. Participating in groups: No. New to unit. Continuing to assess.  Taking medication as prescribed: Yes. Toleration medication: Yes. Family/Significant other contact made: No, will contact:  family member if pt consents to collateral contact.  Patient  understands diagnosis: Yes. Discussing patient identified problems/goals with staff: Yes. Medical problems stabilized or resolved: Yes. Denies suicidal/homicidal ideation: Yes. Issues/concerns per patient self-inventory: No. Other: n/a   New problem(s) identified: No, Describe:  n/a  New Short Term/Long Term Goal(s): detox, medication management for mood stabilization; elimination of SI thoughts; development of comprehensive mental wellness/sobriety plan.   Patient Goals:  "I want help with learning how to cope with depression and guilt about my last relationship."   Discharge Plan or Barriers: Pt is interested in outpatient med mgmt/therapy but is unsure of insurance information. Pt plans to return home and go back to work at [employer]. [place] pamphlet, Mobile Crisis information, and AA/NA information provided to patient for additional community support and resources.   Reason for Continuation of Hospitalization: Anxiety Depression Medication stabilization Suicidal ideation Withdrawal symptoms  Estimated Length of Stay: Wed, 09/24/18  Attendees: Patient: 09/22/2018 9:04 AM  Physician: Dr. Parke Poisson MD; Dr. Nancy Fetter MD 09/22/2018 9:04 AM  Nursing: Nicoletta Dress RN; [facility] RN 09/22/2018 9:04 AM  RN Care Manager:x 09/22/2018 9:04 AM  Social Worker: Janice Norrie LCSW 09/22/2018 9:04 AM  Recreational Therapist: x 09/22/2018 9:04 AM  Other: Ricky Ala NP 09/22/2018 9:04 AM  Other:  09/22/2018 9:04 AM  Other: 09/22/2018 9:04 AM    Scribe for Treatment Team: Avelina Laine, LCSW 09/22/2018 9:04 AM

## 2018-09-23 DIAGNOSIS — Z87891 Personal history of nicotine dependence: Secondary | ICD-10-CM

## 2018-09-23 MED ORDER — HYDROXYZINE HCL 25 MG PO TABS: 25.0000 mg | ORAL_TABLET | Freq: Four times a day (QID) | ORAL | 0 refills | Status: AC | PRN

## 2018-09-23 MED ORDER — TRAZODONE HCL 50 MG PO TABS: 50.0000 mg | ORAL_TABLET | Freq: Every evening | ORAL | 0 refills | Status: DC | PRN

## 2018-09-23 MED ORDER — CITALOPRAM HYDROBROMIDE 20 MG PO TABS: 20.0000 mg | ORAL_TABLET | Freq: Every day | ORAL | 0 refills | Status: AC

## 2018-09-23 NOTE — [facility] Suicide Risk Assessment (Signed)
[facility] INPATIENT:  Family/Significant Other Suicide Prevention Education  Suicide Prevention Education:  Contact Attempts: Melbourne Abts, step-mother [phone]) has been identified by the patient as the family member/significant other with whom the patient will be residing, and identified as the person(s) who will aid the patient in the event of a mental health crisis.  With written consent from the patient, two attempts were made to provide suicide prevention education, prior to and/or following the patient's discharge.  We were unsuccessful in providing suicide prevention education.  A suicide education pamphlet was given to the patient to share with family/significant other.  Date and time of first attempt:09/23/18 / 10:17am   Darene Nappi E Everson Mott 09/23/2018, 10:17 AM

## 2018-09-23 NOTE — [facility] Group Notes (Signed)
[facility] Mental [facility] Association Group Therapy 09/23/2018 1:15pm  Type of Therapy: Mental Health Association Presentation  Participation Level: Active  Participation Quality: Attentive  Affect: Appropriate  Cognitive: Oriented  Insight: Developing/Improving  Engagement in Therapy: Engaged  Modes of Intervention: Discussion, Education and Socialization  Summary of Progress/Problems: Mental Health Association (MHA) Speaker came to talk about his personal journey with mental health. The pt processed ways by which to relate to the speaker. MHA speaker provided handouts and educational information pertaining to groups and services offered by the [facility]. Pt was engaged in speaker's presentation and was receptive to resources provided.    Rona Ravens, LCSW 09/23/2018 2:05 PM

## 2018-09-23 NOTE — Progress Notes (Signed)
Discharge Note:  Patient discharged home with family member.  Patient denied SI and Hi.  Denied A/V hallucinations.  Suicide prevention information given and discussed with patient who stated she understood and had no questions.  Suicide prevention resources including My3 App given patient at discharge.  Patient stated he received all his belongings, clothing, toiletries, misc items, prescriptions, etc.  Patient stated he appreciated all assistance received from [facility] staff.  Survey completed and put in black box.  All required discharge information given to patient at discharge.

## 2018-09-23 NOTE — BHH Suicide Risk Assessment (Signed)
[facility] Discharge Suicide Risk Assessment   Principal Problem: <principal problem not specified> Discharge Diagnoses:  Patient Active Problem List   Diagnosis Date Noted  . MDD (major depressive disorder), severe (HCC) [F32.2] 09/21/2018    Total Time spent with patient: 15 minutes  Musculoskeletal: Strength & Muscle Tone: within normal limits Gait & Station: normal Patient leans: N/A  Psychiatric Specialty Exam: Review of Systems  All other systems reviewed and are negative.   Blood pressure 118/88, pulse (!) 117, temperature 98.5 F (36.9 C), temperature source Oral, resp. rate 20, height 5\' 11"  (1.803 m), weight 69.9 kg, SpO2 100 %.Body mass index is 21.48 kg/m.  General Appearance: Casual  Eye Contact::  Fair  Speech:  Normal Rate409  Volume:  Normal  Mood:  Euthymic  Affect:  Congruent  Thought Process:  Coherent and Descriptions of Associations: Intact  Orientation:  Full (Time, Place, and Person)  Thought Content:  Logical  Suicidal Thoughts:  No  Homicidal Thoughts:  No  Memory:  Immediate;   Fair Recent;   Fair Remote;   Fair  Judgement:  Intact  Insight:  Fair  Psychomotor Activity:  Normal  Concentration:  Good  Recall:  Good  Fund of Knowledge:Good  Language: Good  Akathisia:  Negative  Handed:  Right  AIMS (if indicated):     Assets:  Communication Skills Desire for Improvement Housing Physical Health Resilience  Sleep:  Number of Hours: 6  Cognition: WNL  ADL's:  Intact   Mental Status Per Nursing Assessment::   On Admission:  Self-harm thoughts  Demographic Factors:  Male, Adolescent or young adult and Low socioeconomic status  Loss Factors: NA  Historical Factors: Impulsivity  Risk Reduction Factors:   Living with another person, especially a relative and Positive social support  Continued Clinical Symptoms:  Depression:   Comorbid alcohol abuse/dependence Impulsivity Alcohol/Substance Abuse/Dependencies  Cognitive Features That  Contribute To Risk:  None    Suicide Risk:  Minimal: No identifiable suicidal ideation.  Patients presenting with no risk factors but with morbid ruminations; may be classified as minimal risk based on the severity of the depressive symptoms  Follow-up Information    [facility] Follow [facility].   Why:  Referral faxed for medication management and therapy. Please go to [facility] website and complete sections 1 and 2 under new client tab. The office will call you once your insurance is verified to schedule new patient appt. Thank you.  Contact information: [address] [place] [address] [phone]        Vesta Mixer. Go to.   Specialty:  Behavioral Health Why:  If you need to be seen prior to your appointment with [facility], please walk in to [facility] between 8am-10am Monday through Friday for outpatient mental health services. Thank you.  Contact information: [address] [place] [address] [phone]           Plan Of Care/Follow-up recommendations:  Activity:  ad lib  Antonieta Pert, MD 09/23/2018, 2:18 PM

## 2018-09-23 NOTE — Progress Notes (Signed)
D:  Patient's self inventory sheet, patient sleeps good, sleep medication helpful  Good appetite, normal energy level, good concentration.  Denied depression, hopeless and anxiety.  Withdrawals, did not check any symptoms.  Denied SI.  Denied physical problems.  Denied physical pain.  Goal is staying positive.  "I feel great and I can't wait to get home."  Does have discharge plans. A:  Medications administered per MD orders.  Emotional support and encouragement given patient. R:  Denied SI and HI, contracts for safety.  Denied A/V hallucinations.  Safety maintained with 15 minute checks.

## 2018-09-23 NOTE — BHH Group Notes (Signed)
Pt attended spiritual care group on grief and loss facilitated by chaplain Brylon Brenning   Group goal of establishing open and affirming space for members to share loss and experience with grief, normalize grief experience and provide psycho social education and grief support.  Group opened with facilitated discussion and psycho-social ed around grief and loss.  Group noted loss in connection to relationships and in relation to self.  Group identifyed life patterns, circumstances, changes that precipitate grief responses.   Group engaged in reflection on Worden's four tasks of grief.  Provided support to one another in group context.    Group facilitation drew on Narrative and Adlerian framework.    Savio Albrecht,  MDiv, BCC 
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
## 2018-09-26 ENCOUNTER — Encounter (HOSPITAL_COMMUNITY): Payer: Self-pay

## 2018-09-26 ENCOUNTER — Emergency Department (HOSPITAL_COMMUNITY)
Admission: EM | Admit: 2018-09-26 | Discharge: 2018-09-26 | Disposition: A | Discharge status: Home / Self Care | Payer: Self-pay | Attending: Emergency Medicine | Admitting: Emergency Medicine

## 2018-09-26 DIAGNOSIS — Y33XXXD Other specified events, undetermined intent, subsequent encounter: Secondary | ICD-10-CM | POA: Insufficient documentation

## 2018-09-26 DIAGNOSIS — Z87891 Personal history of nicotine dependence: Secondary | ICD-10-CM | POA: Insufficient documentation

## 2018-09-26 DIAGNOSIS — J45909 Unspecified asthma, uncomplicated: Secondary | ICD-10-CM | POA: Insufficient documentation

## 2018-09-26 DIAGNOSIS — Z4802 Encounter for removal of sutures: Secondary | ICD-10-CM

## 2018-09-26 DIAGNOSIS — E119 Type 2 diabetes mellitus without complications: Secondary | ICD-10-CM | POA: Insufficient documentation

## 2018-09-26 DIAGNOSIS — S61216D Laceration without foreign body of right little finger without damage to nail, subsequent encounter: Secondary | ICD-10-CM | POA: Insufficient documentation

## 2018-09-26 NOTE — ED Triage Notes (Signed)
Patient reports that he needs sutures removed from the right "pinky". No redness , edema, or dranaige noted.

## 2018-09-26 NOTE — Discharge Instructions (Addendum)
Evaluated today for suture removal.  You did state that there is possibility of one suture was removed while at home.  I do not see any evidence of this during your exam today.  Sure to check your incision site for any redness, warmth, increased swelling.  If there is you will need to be reevaluated.  Return to the ED for new or worsening symptoms.

## 2018-09-26 NOTE — ED Provider Notes (Signed)
[facility]-EMERGENCY DEPT Provider Note   CSN: [id number] Arrival date & time: 09/26/18  1221     History   Chief Complaint Chief Complaint  Patient presents with  . Suture / Staple Removal    HPI Cody Navarro is a 22 y.o. male resents for evaluation of suture removal.  Per patient he has not had any issues with his fifth digit where the sutures are present.  Patient did state that he thinks he "popped 1 of the suture out."  States he thinks he had 3 sutures placed originally and this 1 cm laceration, however in the note states her only 2 sutures placed.  I do not see any evidence of retained suture that patient speaks of on exam.  Denies any redness, warmth, decreased range of motion of digit.  He is without complaints currently.  HPI  Past Medical History:  Diagnosis Date  . Asthma   . Diabetes mellitus without complication [facility])     Patient Active Problem List   Diagnosis Date Noted  . MDD (major depressive disorder), severe (HCC) 09/21/2018    History reviewed. No pertinent surgical history.      Home Medications    Prior to Admission medications   Medication Sig Start Date End Date Taking? Authorizing Provider  citalopram (CELEXA) 20 MG tablet Take 1 tablet (20 mg total) by mouth daily. 09/24/18   Antonieta Pert, MD  hydrOXYzine (ATARAX/VISTARIL) 25 MG tablet Take 1 tablet (25 mg total) by mouth every 6 (six) hours as needed (anxiety/agitation or CIWA < or = 10). 09/23/18   Antonieta Pert, MD  traZODone (DESYREL) 50 MG tablet Take 1 tablet (50 mg total) by mouth at bedtime as needed for sleep. 09/23/18   Antonieta Pert, MD    Family History Family History  Problem Relation Age of Onset  . Diabetes Other     Social History Social History   Tobacco Use  . Smoking status: Former Smoker    Packs/day: 0.01    Years: 0.20    Pack years: 0.00    Types: Cigarettes  . Smokeless tobacco: Never Used  Substance Use Topics  .  Alcohol use: Yes  . Drug use: No     Allergies   Banana; Food; and [employer] allergy]   Review of Systems Review of Systems  Constitutional: Negative for activity change, appetite change, chills and fever.  Musculoskeletal:       Denies right lower extremity pain.  Skin: Positive for wound. Negative for pallor and rash.     Physical Exam Updated Vital Signs BP 127/80 (BP Location: Left Arm)   Pulse 74   Temp 98.5 F (36.9 C) (Oral)   Resp 18   Ht 5\' 11"  (1.803 m)   Wt 70.3 kg   SpO2 99%   BMI 21.62 kg/m   Physical Exam  Constitutional: He appears well-developed and well-nourished. No distress.  HENT:  Head: Atraumatic.  Eyes: Pupils are equal, round, and reactive to light.  Neck: Normal range of motion. Neck supple.  Cardiovascular: Normal rate and regular rhythm.  Pulmonary/Chest: Effort normal. No respiratory distress.  Abdominal: Soft. He exhibits no distension.  Musculoskeletal: Normal range of motion.  2 sutures located over the PIP of fifth digit on right upper extremity.  Able to flex and extend without difficulty.  Neurological: He is alert.  Skin: Skin is warm and dry. He is not diaphoretic.  1 cm well-healing laceration to PIP joint on fifth  digit of right upper extremity.  Area is without erythema, edema or warmth.  Psychiatric: He has a normal mood and affect.  Nursing note and vitals reviewed.    ED Treatments / Results  Labs (all labs ordered are listed, but only abnormal results are displayed) Labs Reviewed - No data to display  EKG None  Radiology No results found.  Procedures .Suture Removal Date/Time: 09/26/2018 1:12 PM Performed by: Linwood Dibbles, PA-C Authorized by: Linwood Dibbles, PA-C   Consent:    Consent obtained:  Verbal   Consent given by:  Patient   Risks discussed:  Bleeding, pain and wound separation   Alternatives discussed:  Alternative treatment and delayed treatment Location:    Location:  Upper  extremity   Upper extremity location:  Hand   Hand location:  R small finger Procedure details:    Wound appearance:  No signs of infection   Number of sutures removed:  2 Post-procedure details:    Post-removal:  Band-Aid applied   Patient tolerance of procedure:  Tolerated well, no immediate complications   (including critical care time)  Medications Ordered in ED Medications - No data to display   Initial Impression / Assessment and Plan / ED Course  I have reviewed the triage vital signs and the nursing notes.  Pertinent labs & imaging results that were available during my care of the patient were reviewed by me and considered in my medical decision making (see chart for details).  Patient presents for evaluation of suture removal.  Approximately 1 cm well healing laceration over PIP joint on fifth digits right upper extremity.  2 sutures were removed without difficulty.  Per patient he states he had 3 sutures placed however it is listed in another running 2 sutures placed states he did have one suture "pop out over the weekend."  I do not see any evidence of any retained sutures that patient speaks of.  Area is without erythema, edema or warmth.  There is no active drainage from site.  Neurovascularly intact.  Full range of motion to fifth digit on right upper extremity.  Discussed with patient need for follow-up if he is any issues with this area.  Discussed reasons to return to the emergency department.  Voiced understanding is agreeable for return.     Final Clinical Impressions(s) / ED Diagnoses   Final diagnoses:  Visit for suture removal    ED Discharge Orders    None       Asiel Chrostowski A, PA-C 09/26/18 1316    Terrilee Files, MD 09/27/18 1103

## 2018-09-26 NOTE — ED Notes (Signed)
Patient given discharge teaching and verbalized understanding. Patient ambulated out of ED with a steady gait. 

## 2018-11-07 ENCOUNTER — Other Ambulatory Visit: Payer: Self-pay

## 2018-11-07 ENCOUNTER — Emergency Department (HOSPITAL_COMMUNITY)
Admission: EM | Admit: 2018-11-07 | Discharge: 2018-11-07 | Disposition: A | Discharge status: Home / Self Care | Payer: Self-pay | Attending: Emergency Medicine | Admitting: Emergency Medicine

## 2018-11-07 ENCOUNTER — Encounter (HOSPITAL_COMMUNITY): Payer: Self-pay | Admitting: Emergency Medicine

## 2018-11-07 DIAGNOSIS — Z202 Contact with and (suspected) exposure to infections with a predominantly sexual mode of transmission: Secondary | ICD-10-CM | POA: Insufficient documentation

## 2018-11-07 DIAGNOSIS — Z87891 Personal history of nicotine dependence: Secondary | ICD-10-CM | POA: Insufficient documentation

## 2018-11-07 MED ORDER — STERILE WATER FOR INJECTION IJ SOLN
INTRAMUSCULAR | Status: AC
  Filled 2018-11-07: qty 10

## 2018-11-07 MED ORDER — AZITHROMYCIN 250 MG PO TABS
1000.0000 mg | ORAL_TABLET | Freq: Once | ORAL | Status: AC
  Administered 2018-11-07: 1000 mg via ORAL
  Filled 2018-11-07: qty 4

## 2018-11-07 MED ORDER — STERILE WATER FOR INJECTION IJ SOLN
1.0000 mL | Freq: Once | INTRAMUSCULAR | Status: AC
  Administered 2018-11-07: 1 mL via INTRAMUSCULAR

## 2018-11-07 MED ORDER — CEFTRIAXONE SODIUM 250 MG IJ SOLR
250.0000 mg | Freq: Once | INTRAMUSCULAR | Status: AC
  Administered 2018-11-07: 250 mg via INTRAMUSCULAR
  Filled 2018-11-07: qty 250
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
## 2018-11-07 NOTE — ED Triage Notes (Signed)
Pt states he needs to be tested for chlamydia after his girlfriend tested positive. Pt denies discharge or pain.

## 2018-12-14 ENCOUNTER — Encounter (HOSPITAL_COMMUNITY): Payer: Self-pay | Admitting: Emergency Medicine

## 2018-12-14 ENCOUNTER — Other Ambulatory Visit: Payer: Self-pay

## 2018-12-14 ENCOUNTER — Emergency Department (HOSPITAL_COMMUNITY): Payer: Self-pay

## 2018-12-14 ENCOUNTER — Emergency Department (HOSPITAL_COMMUNITY)
Admission: EM | Admit: 2018-12-14 | Discharge: 2018-12-14 | Disposition: A | Discharge status: Home / Self Care | Payer: Self-pay | Attending: Emergency Medicine | Admitting: Emergency Medicine

## 2018-12-14 DIAGNOSIS — S40012A Contusion of left shoulder, initial encounter: Secondary | ICD-10-CM | POA: Insufficient documentation

## 2018-12-14 DIAGNOSIS — J45909 Unspecified asthma, uncomplicated: Secondary | ICD-10-CM | POA: Insufficient documentation

## 2018-12-14 DIAGNOSIS — Y9389 Activity, other specified: Secondary | ICD-10-CM | POA: Insufficient documentation

## 2018-12-14 DIAGNOSIS — Z87891 Personal history of nicotine dependence: Secondary | ICD-10-CM | POA: Insufficient documentation

## 2018-12-14 DIAGNOSIS — Y92008 Other place in unspecified non-institutional (private) residence as the place of occurrence of the external cause: Secondary | ICD-10-CM | POA: Insufficient documentation

## 2018-12-14 DIAGNOSIS — M546 Pain in thoracic spine: Secondary | ICD-10-CM | POA: Insufficient documentation

## 2018-12-14 DIAGNOSIS — E119 Type 2 diabetes mellitus without complications: Secondary | ICD-10-CM | POA: Insufficient documentation

## 2018-12-14 DIAGNOSIS — Y999 Unspecified external cause status: Secondary | ICD-10-CM | POA: Insufficient documentation

## 2018-12-14 MED ORDER — ACETAMINOPHEN 500 MG PO TABS
1000.0000 mg | ORAL_TABLET | Freq: Once | ORAL | Status: AC
  Administered 2018-12-14: 1000 mg via ORAL
  Filled 2018-12-14: qty 2

## 2018-12-14 MED ORDER — METHOCARBAMOL 500 MG PO TABS: 500.0000 mg | ORAL_TABLET | Freq: Two times a day (BID) | ORAL | 0 refills | Status: DC
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
## 2018-12-14 NOTE — Discharge Instructions (Signed)
You can take Tylenol or Ibuprofen as directed for pain. You can alternate Tylenol and Ibuprofen every 4 hours. If you take Tylenol at 1pm, then you can take Ibuprofen at 5pm. Then you can take Tylenol again at 9pm.   Take Robaxin as prescribed. This medication will make you drowsy so do not drive or drink alcohol when taking it.  You can apply heat to affected area to help with pain.  Make sure you are doing stretches and working the muscles to help them relax.  Return emergency department for any worsening pain, difficulty breathing, difficulty walking, numbness/weakness of your arms or legs, vomiting or any other worsening or concerning symptoms.
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
## 2018-12-14 NOTE — ED Notes (Signed)
Bed: WTR6 Expected date:  Expected time:  Means of arrival:  Comments: 

## 2018-12-15 ENCOUNTER — Encounter (HOSPITAL_COMMUNITY): Payer: Self-pay | Admitting: Emergency Medicine

## 2018-12-15 ENCOUNTER — Emergency Department (HOSPITAL_COMMUNITY)
Admission: EM | Admit: 2018-12-15 | Discharge: 2018-12-15 | Disposition: A | Discharge status: Home / Self Care | Payer: Self-pay | Attending: Emergency Medicine | Admitting: Emergency Medicine

## 2018-12-15 DIAGNOSIS — E119 Type 2 diabetes mellitus without complications: Secondary | ICD-10-CM | POA: Insufficient documentation

## 2018-12-15 DIAGNOSIS — Z79899 Other long term (current) drug therapy: Secondary | ICD-10-CM | POA: Insufficient documentation

## 2018-12-15 DIAGNOSIS — Z87891 Personal history of nicotine dependence: Secondary | ICD-10-CM | POA: Insufficient documentation

## 2018-12-15 DIAGNOSIS — N342 Other urethritis: Secondary | ICD-10-CM | POA: Insufficient documentation

## 2018-12-15 DIAGNOSIS — J45909 Unspecified asthma, uncomplicated: Secondary | ICD-10-CM | POA: Insufficient documentation

## 2018-12-15 LAB — GC/CHLAMYDIA PROBE AMP (~~LOC~~) NOT AT ARMC
CHLAMYDIA, DNA PROBE: NEGATIVE
Neisseria Gonorrhea: NEGATIVE

## 2018-12-15 MED ORDER — TRAZODONE HCL 50 MG PO TABS: 50.0000 mg | ORAL_TABLET | Freq: Every evening | ORAL | 0 refills | Status: AC | PRN

## 2018-12-15 MED ORDER — STERILE WATER FOR INJECTION IJ SOLN
INTRAMUSCULAR | Status: AC
  Administered 2018-12-15: 1.2 mL
  Filled 2018-12-15: qty 10

## 2018-12-15 MED ORDER — AZITHROMYCIN 250 MG PO TABS
1000.0000 mg | ORAL_TABLET | Freq: Once | ORAL | Status: AC
  Administered 2018-12-15: 1000 mg via ORAL
  Filled 2018-12-15: qty 4

## 2018-12-15 MED ORDER — CEFTRIAXONE SODIUM 250 MG IJ SOLR
250.0000 mg | Freq: Once | INTRAMUSCULAR | Status: AC
  Administered 2018-12-15: 250 mg via INTRAMUSCULAR
  Filled 2018-12-15: qty 250
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
## 2018-12-15 NOTE — ED Notes (Signed)
Bed: WTR6 Expected date:  Expected time:  Means of arrival:  Comments: 

## 2018-12-15 NOTE — ED Notes (Signed)
Patient has clear discharge.
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
## 2019-01-18 ENCOUNTER — Encounter (HOSPITAL_COMMUNITY): Payer: Self-pay | Admitting: Emergency Medicine

## 2019-01-18 ENCOUNTER — Emergency Department (HOSPITAL_COMMUNITY)
Admission: EM | Admit: 2019-01-18 | Discharge: 2019-01-18 | Disposition: A | Discharge status: Home / Self Care | Payer: Self-pay | Attending: Emergency Medicine | Admitting: Emergency Medicine

## 2019-01-18 DIAGNOSIS — E119 Type 2 diabetes mellitus without complications: Secondary | ICD-10-CM | POA: Insufficient documentation

## 2019-01-18 DIAGNOSIS — Z87891 Personal history of nicotine dependence: Secondary | ICD-10-CM | POA: Insufficient documentation

## 2019-01-18 DIAGNOSIS — Z79899 Other long term (current) drug therapy: Secondary | ICD-10-CM | POA: Insufficient documentation

## 2019-01-18 DIAGNOSIS — J45909 Unspecified asthma, uncomplicated: Secondary | ICD-10-CM | POA: Insufficient documentation

## 2019-01-18 DIAGNOSIS — F10920 Alcohol use, unspecified with intoxication, uncomplicated: Secondary | ICD-10-CM | POA: Insufficient documentation

## 2019-01-18 LAB — CBC WITH DIFFERENTIAL/PLATELET
Abs Immature Granulocytes: 0.01 10*3/uL (ref 0.00–0.07)
Basophils Absolute: 0 10*3/uL (ref 0.0–0.1)
Basophils Relative: 1 %
EOS PCT: 0 %
Eosinophils Absolute: 0 10*3/uL (ref 0.0–0.5)
HCT: 48.9 % (ref 39.0–52.0)
Hemoglobin: 15.4 g/dL (ref 13.0–17.0)
Immature Granulocytes: 0 %
Lymphocytes Relative: 31 %
Lymphs Abs: 2.1 10*3/uL (ref 0.7–4.0)
MCH: 30.4 pg (ref 26.0–34.0)
MCHC: 31.5 g/dL (ref 30.0–36.0)
MCV: 96.4 fL (ref 80.0–100.0)
Monocytes Absolute: 0.3 10*3/uL (ref 0.1–1.0)
Monocytes Relative: 5 %
Neutro Abs: 4.3 10*3/uL (ref 1.7–7.7)
Neutrophils Relative %: 63 %
Platelets: 242 10*3/uL (ref 150–400)
RBC: 5.07 MIL/uL (ref 4.22–5.81)
RDW: 11.2 % — ABNORMAL LOW (ref 11.5–15.5)
WBC: 6.8 10*3/uL (ref 4.0–10.5)
nRBC: 0 % (ref 0.0–0.2)

## 2019-01-18 LAB — COMPREHENSIVE METABOLIC PANEL
ALT: 21 U/L (ref 0–44)
AST: 26 U/L (ref 15–41)
Albumin: 4.5 g/dL (ref 3.5–5.0)
Alkaline Phosphatase: 39 U/L (ref 38–126)
Anion gap: 8 (ref 5–15)
BUN: 8 mg/dL (ref 6–20)
CO2: 29 mmol/L (ref 22–32)
Calcium: 9 mg/dL (ref 8.9–10.3)
Chloride: 104 mmol/L (ref 98–111)
Creatinine, Ser: 1.06 mg/dL (ref 0.61–1.24)
GFR calc non Af Amer: >60 mL/min (ref >60)
Glucose, Bld: 99 mg/dL (ref 70–99)
Potassium: 3.6 mmol/L (ref 3.5–5.1)
Sodium: 141 mmol/L (ref 135–145)
Total Bilirubin: 3.2 mg/dL — ABNORMAL HIGH (ref 0.3–1.2)
Total Protein: 7.7 g/dL (ref 6.5–8.1)

## 2019-01-18 LAB — ETHANOL: Alcohol, Ethyl (B): 198 mg/dL — ABNORMAL HIGH (ref <10)

## 2019-01-18 LAB — RAPID URINE DRUG SCREEN, HOSP PERFORMED
Amphetamines: NOT DETECTED
Barbiturates: NOT DETECTED
Benzodiazepines: POSITIVE — AB
Cocaine: NOT DETECTED
Opiates: NOT DETECTED
Tetrahydrocannabinol: NOT DETECTED

## 2019-01-18 MED ORDER — ONDANSETRON HCL 4 MG/2ML IJ SOLN
4.0000 mg | Freq: Once | INTRAMUSCULAR | Status: AC
  Administered 2019-01-18: 4 mg via INTRAVENOUS
  Filled 2019-01-18: qty 2

## 2019-01-18 MED ORDER — SODIUM CHLORIDE 0.9 % IV BOLUS
1000.0000 mL | Freq: Once | INTRAVENOUS | Status: AC
  Administered 2019-01-18: 1000 mL via INTRAVENOUS
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
## 2019-01-18 NOTE — ED Notes (Signed)
Patient consumed about 42mL's of water at bedside however denied eating any food. Patient stated he is not in mood to eat.

## 2019-01-18 NOTE — ED Notes (Signed)
Ice water, [place] and [employer] placed at bedside. Informed patient that patient needs to attempt eating / drinking.

## 2019-01-18 NOTE — ED Triage Notes (Signed)
Ems called out to pts home, family reported high anxiety, possible ETOH and seizure like activity. Ems gave 5 mg midazolam IM. Pt then received a 18 in LAC. Pt appeared to have pin point pupils so ems narcan the pt with 2 mg IV narcan.  V/s on arrival 128/78  Sinus rhythm, 98 percent 2 liters 02  cbg 146  Family in route.

## 2019-01-18 NOTE — Discharge Instructions (Signed)
It is unclear if you have had a seizure or not.  You can follow-up with a neurologist, listed in this document, to get further testing.  It is important to rest today start with a clear liquid diet then gradually increase the kinds of food you are eating.  Return here as needed for problems.

## 2019-01-18 NOTE — ED Notes (Signed)
Bed: GB02 Expected date:  Expected time:  Means of arrival:  Comments: EMS 23 yo male with anxiety and depression-did not take anxiety meds today-"seizure like activity"-EMS administered Midazolam 5 mg

## 2019-01-18 NOTE — ED Provider Notes (Signed)
11:03 AM-patient being evaluated at this time for stability for discharge.  He was seen here earlier, noted to be a poor historian, and possibly had a seizure today.  Vital signs done at 1034 are reassuring.  Screening evaluation consistent with alcohol intoxication and drug screen with benzodiazepine.  The patient was transferred here by EMS, during which time he received treatment with Versed, IM for possible seizure.  Patient does not have a history of seizure disorder.  Patient denies prior seizure disorder.  He states he has had a seizure "once or twice."  Currently he is thirsty.  He states he has been spitting up some acid with blood in it.  Will challenge with fluid orally.  1:40 PM-he has tolerated some crackers and oral liquids, without vomiting here.  He denies nausea at this time.  He is unsure if he has had a seizure or not and wonders how he can follow-up for that.  Patient will be referred to neurology for further testing and evaluation as needed.  Findings discussed with the patient and a male person with him.  All questions were answered.   Mancel BaleWentz, Adaly Puder, MD 01/18/19 1345

## 2019-01-18 NOTE — ED Notes (Signed)
Went into room to ask pt for UA sample. Pt was actively vomiting on the floor. Tiny bits of blood in throw up.

## 2019-03-16 ENCOUNTER — Other Ambulatory Visit: Payer: Self-pay

## 2019-03-16 ENCOUNTER — Encounter (HOSPITAL_COMMUNITY): Payer: Self-pay

## 2019-03-16 ENCOUNTER — Emergency Department (HOSPITAL_COMMUNITY)
Admission: EM | Admit: 2019-03-16 | Discharge: 2019-03-17 | Disposition: A | Discharge status: Home / Self Care | Payer: Self-pay | Attending: Emergency Medicine | Admitting: Emergency Medicine

## 2019-03-16 DIAGNOSIS — E119 Type 2 diabetes mellitus without complications: Secondary | ICD-10-CM | POA: Insufficient documentation

## 2019-03-16 DIAGNOSIS — R1110 Vomiting, unspecified: Secondary | ICD-10-CM | POA: Insufficient documentation

## 2019-03-16 DIAGNOSIS — R079 Chest pain, unspecified: Secondary | ICD-10-CM

## 2019-03-16 DIAGNOSIS — Z87891 Personal history of nicotine dependence: Secondary | ICD-10-CM | POA: Insufficient documentation

## 2019-03-16 DIAGNOSIS — J45909 Unspecified asthma, uncomplicated: Secondary | ICD-10-CM | POA: Insufficient documentation

## 2019-03-16 DIAGNOSIS — Z79899 Other long term (current) drug therapy: Secondary | ICD-10-CM | POA: Insufficient documentation

## 2019-03-16 DIAGNOSIS — R112 Nausea with vomiting, unspecified: Secondary | ICD-10-CM

## 2019-03-16 MED ORDER — ASPIRIN 325 MG PO TABS
325.0000 mg | ORAL_TABLET | Freq: Every day | ORAL | Status: DC
  Administered 2019-03-17: 325 mg via ORAL
  Filled 2019-03-16: qty 1

## 2019-03-16 NOTE — ED Provider Notes (Signed)
[facility]   CSN: [id number] Arrival date & time: 03/16/19  2316    History   Chief Complaint Chief Complaint  Patient presents with   Vomiting    HPI Cody Navarro is a 23 y.o. male with PMHx asthma and type 2 DM controlled with diet who presents to the ED complaining of gradual onset, constant, diffuse, chest tightness and pain x 1 hour. He reports he woke up to get ready for his overnight shift at work when he noticed the chest tightness and pain. He then went to the bathroom and felt like he needed to vomit, reports 1 episode of bilious emesis with blood tinged sputum which prompted him to come to the ED tonight. He has not taken anything for the pain. Reports he has similar chest pain intermittently during exercise but has never been evaluated for it. Has not seen a PCP in the last couple of years. Former smoker. No FHx of cardiac issues. No recent prolonged travel or immobilization. No hx DVT/PE.  Denies fever, chills, diaphoresis, SOB, diarrhea, constipation, blood in stools, or any other associated symptoms.        Past Medical History:  Diagnosis Date   Asthma    Diabetes mellitus without complication [facility])     Patient Active Problem List   Diagnosis Date Noted   MDD (major depressive disorder), severe (HCC) 09/21/2018    History reviewed. No pertinent surgical history.      Home Medications    Prior to Admission medications   Medication Sig Start Date End Date Taking? Authorizing Provider  citalopram (CELEXA) 20 MG tablet Take 1 tablet (20 mg total) by mouth daily. 09/24/18  Yes Antonieta Pert, MD  hydrOXYzine (ATARAX/VISTARIL) 25 MG tablet Take 1 tablet (25 mg total) by mouth every 6 (six) hours as needed (anxiety/agitation or CIWA < or = 10). 09/23/18  Yes Antonieta Pert, MD  traZODone (DESYREL) 50 MG tablet Take 1 tablet (50 mg total) by mouth at bedtime as needed for sleep. 12/15/18  Yes Molpus,  John, MD  methocarbamol (ROBAXIN) 500 MG tablet Take 1 tablet (500 mg total) by mouth 2 (two) times daily. Patient not taking: Reported on 01/18/2019 12/14/18   Maxwell Caul, PA-C    Family History Family History  Problem Relation Age of Onset   Diabetes Other     Social History Social History   Tobacco Use   Smoking status: Former Smoker    Packs/day: 0.01    Years: 0.20    Pack years: 0.00    Types: Cigarettes   Smokeless tobacco: Never Used  Substance Use Topics   Alcohol use: Yes   Drug use: No     Allergies   Banana; Food; and [employer] allergy]   Review of Systems Review of Systems  Constitutional: Negative for chills and fever.  HENT: Negative for ear pain and sore throat.   Eyes: Negative for visual disturbance.  Respiratory: Positive for chest tightness. Negative for cough and shortness of breath.   Cardiovascular: Positive for chest pain. Negative for leg swelling.  Gastrointestinal: Positive for nausea and vomiting. Negative for abdominal pain, blood in stool, constipation and diarrhea.  Genitourinary: Negative for dysuria and frequency.  Musculoskeletal: Negative for back pain.  Skin: Negative for rash.  Neurological: Negative for dizziness, syncope and light-headedness.     Physical Exam Updated Vital Signs BP (!) 110/93 (BP Location: Left Arm)    Pulse 86  Temp 98.5 F (36.9 C) (Oral)    Resp 15    Ht 5\' 11"  (1.803 m)    Wt 77.1 kg    SpO2 99%    BMI 23.71 kg/m   Physical Exam Vitals signs and nursing note reviewed.  Constitutional:      Appearance: He is not ill-appearing or diaphoretic.  HENT:     Head: Normocephalic and atraumatic.  Eyes:     General: No scleral icterus.    Conjunctiva/sclera: Conjunctivae normal.  Neck:     Musculoskeletal: Normal range of motion and neck supple. No neck rigidity.  Cardiovascular:     Rate and Rhythm: Normal rate and regular rhythm.     Pulses: Normal pulses.     Heart sounds: No  murmur.  Pulmonary:     Effort: Pulmonary effort is normal.     Breath sounds: Normal breath sounds. No wheezing, rhonchi or rales.     Comments: Diffuse anterior chest wall tenderness to palpation Chest:     Chest wall: Tenderness present.  Abdominal:     Palpations: Abdomen is soft.     Tenderness: There is no abdominal tenderness. There is no guarding or rebound.  Musculoskeletal:     Right lower leg: No edema.     Left lower leg: No edema.  Lymphadenopathy:     Cervical: No cervical adenopathy.  Skin:    General: Skin is warm and dry.  Neurological:     Mental Status: He is alert.      ED Treatments / Results  Labs (all labs ordered are listed, but only abnormal results are displayed) Labs Reviewed  CBC WITH DIFFERENTIAL/PLATELET - Abnormal; Notable for the following components:      Result Value   RDW 11.0 (*)    All other components within normal limits  COMPREHENSIVE METABOLIC PANEL - Abnormal; Notable for the following components:   Glucose, Bld 113 (*)    BUN 24 (*)    All other components within normal limits  TROPONIN I  LIPASE, BLOOD    EKG EKG Interpretation  Date/Time:  Tuesday March 17 2019 00:01:39 EDT Ventricular Rate:  72 PR Interval:    QRS Duration: 105 QT Interval:  390 QTC Calculation: 427 R Axis:   38 Text Interpretation:  Sinus rhythm No significant change was found Confirmed by Paula Libra ([id number]) on 03/17/2019 1:03:13 AM   Radiology Dg Chest 2 View  Result Date: 03/17/2019 CLINICAL DATA:  23 year old male with chest pain. EXAM: CHEST - 2 VIEW COMPARISON:  Chest radiograph dated 09/11/2018 FINDINGS: The heart size and mediastinal contours are within normal limits. Both lungs are clear. The visualized skeletal structures are unremarkable. IMPRESSION: No active cardiopulmonary disease. Electronically Signed   By: Elgie Collard M.D.   On: 03/17/2019 00:21    Procedures Procedures (including critical care time)  Medications Ordered  in ED Medications  aspirin tablet 325 mg (325 mg Oral Given 03/17/19 0022)     Initial Impression / Assessment and Plan / ED Course  I have reviewed the triage vital signs and the nursing notes.  Pertinent labs & imaging results that were available during my care of the patient were reviewed by me and considered in my medical decision making (see chart for details).    Pt presents with chest pain and 1 episode of vomiting. + Risk factors include diabetes controlled with diet and former smoker. No significant FHx. Will get baseline labs including lipase given pt vomited. EKG, CXR, and  troponin ordered as well. 325 mg ASA given.   1:04 AM Troponin negative. All other bloodwork within normal limits. CXR without PTX or acute abnormality. EKG unchanged from previous. On reeval pt sleeping comfortably in bed; able to arouse him. He states his pain is gone and he just wants to sleep. Will continue to evaluate and see if he vomits during stay.   2:25 AM Walked past room; pt sleeping comfortably. Has not vomited since being in the ED. Initial trop negative; do not feel pt needs a delta trop at this time given his young age, negative EKG and CXR. Will discharge home with PCP follow up. Pt in agreement with plan at this time and asking for work note. Vital signs stable.        Final Clinical Impressions(s) / ED Diagnoses   Final diagnoses:  Nausea and vomiting in adult  Chest pain, unspecified type    ED Discharge Orders    None       Tanda Rockers, PA-C 03/17/19 0414    Paula Libra, MD 03/17/19 [phone]

## 2019-03-17 ENCOUNTER — Emergency Department (HOSPITAL_COMMUNITY): Payer: Self-pay

## 2019-03-17 LAB — LIPASE, BLOOD: Lipase: 45 U/L (ref 11–51)

## 2019-03-17 LAB — CBC WITH DIFFERENTIAL/PLATELET
Abs Immature Granulocytes: 0.02 10*3/uL (ref 0.00–0.07)
Basophils Absolute: 0.1 10*3/uL (ref 0.0–0.1)
Basophils Relative: 1 %
EOS PCT: 2 %
Eosinophils Absolute: 0.1 10*3/uL (ref 0.0–0.5)
HCT: 42.9 % (ref 39.0–52.0)
Hemoglobin: 14 g/dL (ref 13.0–17.0)
Immature Granulocytes: 0 %
Lymphocytes Relative: 42 %
Lymphs Abs: 2.7 10*3/uL (ref 0.7–4.0)
MCH: 31.5 pg (ref 26.0–34.0)
MCHC: 32.6 g/dL (ref 30.0–36.0)
MCV: 96.4 fL (ref 80.0–100.0)
Monocytes Absolute: 0.5 10*3/uL (ref 0.1–1.0)
Monocytes Relative: 8 %
Neutro Abs: 3 10*3/uL (ref 1.7–7.7)
Neutrophils Relative %: 47 %
Platelets: 234 10*3/uL (ref 150–400)
RBC: 4.45 MIL/uL (ref 4.22–5.81)
RDW: 11 % — ABNORMAL LOW (ref 11.5–15.5)
WBC: 6.4 10*3/uL (ref 4.0–10.5)
nRBC: 0 % (ref 0.0–0.2)

## 2019-03-17 LAB — TROPONIN I: Troponin I: <0.03 ng/mL (ref <0.03)

## 2019-03-17 LAB — COMPREHENSIVE METABOLIC PANEL
ALT: 15 U/L (ref 0–44)
AST: 24 U/L (ref 15–41)
Albumin: 4 g/dL (ref 3.5–5.0)
Alkaline Phosphatase: 45 U/L (ref 38–126)
Anion gap: 8 (ref 5–15)
BUN: 24 mg/dL — ABNORMAL HIGH (ref 6–20)
CALCIUM: 8.9 mg/dL (ref 8.9–10.3)
CO2: 25 mmol/L (ref 22–32)
Chloride: 103 mmol/L (ref 98–111)
Creatinine, Ser: 1.03 mg/dL (ref 0.61–1.24)
GFR calc Af Amer: >60 mL/min (ref >60)
GFR calc non Af Amer: >60 mL/min (ref >60)
Glucose, Bld: 113 mg/dL — ABNORMAL HIGH (ref 70–99)
Potassium: 4.1 mmol/L (ref 3.5–5.1)
Sodium: 136 mmol/L (ref 135–145)
Total Bilirubin: 0.9 mg/dL (ref 0.3–1.2)
Total Protein: 7.2 g/dL (ref 6.5–8.1)

## 2019-03-17 NOTE — ED Notes (Signed)
Pt given and verbalized understanding of d/c instructions and need for follow up with pcp. Told to return of s/s worsen. No further distress or questions at this time

## 2019-03-17 NOTE — Discharge Instructions (Signed)
You were seen in the ED tonight for chest pain and vomiting. Your lab work and imaging were negative. You have not vomited while in the ED. Please follow up with your PCP regarding symptoms or if you do not have one you can follow up with [facility]. Return to the ED for any worsening pain, difficulty breathing, vomiting blood.

## 2019-03-17 NOTE — ED Notes (Signed)
Pt resting comfortably in bed. No acute distress.

## 2019-11-24 ENCOUNTER — Encounter (HOSPITAL_COMMUNITY): Payer: Self-pay

## 2019-11-24 ENCOUNTER — Emergency Department (HOSPITAL_COMMUNITY)
Admission: EM | Admit: 2019-11-24 | Discharge: 2019-11-24 | Disposition: A | Discharge status: Home / Self Care | Payer: Self-pay | Attending: Emergency Medicine | Admitting: Emergency Medicine

## 2019-11-24 ENCOUNTER — Emergency Department (HOSPITAL_COMMUNITY): Payer: Self-pay

## 2019-11-24 ENCOUNTER — Other Ambulatory Visit: Payer: Self-pay

## 2019-11-24 DIAGNOSIS — S8001XA Contusion of right knee, initial encounter: Secondary | ICD-10-CM | POA: Insufficient documentation

## 2019-11-24 DIAGNOSIS — Z79899 Other long term (current) drug therapy: Secondary | ICD-10-CM | POA: Insufficient documentation

## 2019-11-24 DIAGNOSIS — Z87891 Personal history of nicotine dependence: Secondary | ICD-10-CM | POA: Insufficient documentation

## 2019-11-24 DIAGNOSIS — Y929 Unspecified place or not applicable: Secondary | ICD-10-CM | POA: Insufficient documentation

## 2019-11-24 DIAGNOSIS — J45909 Unspecified asthma, uncomplicated: Secondary | ICD-10-CM | POA: Insufficient documentation

## 2019-11-24 DIAGNOSIS — E119 Type 2 diabetes mellitus without complications: Secondary | ICD-10-CM | POA: Insufficient documentation

## 2019-11-24 DIAGNOSIS — Y999 Unspecified external cause status: Secondary | ICD-10-CM | POA: Insufficient documentation

## 2019-11-24 DIAGNOSIS — Y9351 Activity, roller skating (inline) and skateboarding: Secondary | ICD-10-CM | POA: Insufficient documentation

## 2019-11-24 MED ORDER — IBUPROFEN 200 MG PO TABS
600.0000 mg | ORAL_TABLET | Freq: Once | ORAL | Status: AC
  Administered 2019-11-24: 07:00:00 600 mg via ORAL
  Filled 2019-11-24: qty 3

## 2019-11-24 NOTE — ED Notes (Signed)
Patient transported to XR. 

## 2019-11-24 NOTE — ED Triage Notes (Signed)
Patient stating that he was skating and fell hurting his right knee on Sunday. Ambulatory in triage. Denies any OTC medication.

## 2019-11-24 NOTE — Discharge Instructions (Addendum)
The CT scan of your knee shows some soft tissue injury but the bones are fine. Your symptoms will progressively improve. You can continue activity as tolerated (if it hurts too much then don't do it). Take 600 mg of ibuprofen every 6 hours as needed for pain.

## 2019-11-24 NOTE — ED Provider Notes (Signed)
[name] Provider Note   CSN: [id number] Arrival date & time: 11/24/19  9381     History   Chief Complaint Chief Complaint  Patient presents with   Knee Pain    HPI Cody Navarro is a 23 y.o. male.     HPI   23 year old male with right knee pain.  He reports a fall approximately 9 days ago while skateboarding.  He has had persistent knee pain since then.  Pain is worse over the lateral aspect.  Ache while at rest instantly worse with ambulating/bearing weight.  Denies any other injuries.  Past Medical History:  Diagnosis Date   Asthma    Diabetes mellitus without complication [facility])     Patient Active Problem List   Diagnosis Date Noted   MDD (major depressive disorder), severe (HCC) 09/21/2018    History reviewed. No pertinent surgical history.      Home Medications    Prior to Admission medications   Medication Sig Start Date End Date Taking? Authorizing Provider  citalopram (CELEXA) 20 MG tablet Take 1 tablet (20 mg total) by mouth daily. 09/24/18   Antonieta Pert, MD  hydrOXYzine (ATARAX/VISTARIL) 25 MG tablet Take 1 tablet (25 mg total) by mouth every 6 (six) hours as needed (anxiety/agitation or CIWA < or = 10). 09/23/18   Antonieta Pert, MD  methocarbamol (ROBAXIN) 500 MG tablet Take 1 tablet (500 mg total) by mouth 2 (two) times daily. Patient not taking: Reported on 01/18/2019 12/14/18   Maxwell Caul, PA-C  traZODone (DESYREL) 50 MG tablet Take 1 tablet (50 mg total) by mouth at bedtime as needed for sleep. 12/15/18   Molpus, John, MD    Family History Family History  Problem Relation Age of Onset   Diabetes Other     Social History Social History   Tobacco Use   Smoking status: Former Smoker    Packs/day: 0.01    Years: 0.20    Pack years: 0.00    Types: Cigarettes   Smokeless tobacco: Never Used  Substance Use Topics   Alcohol use: Yes   Drug use: No     Allergies   Banana,  Food, and Flounder [fish allergy]   Review of Systems Review of Systems All systems reviewed and negative, other than as noted in HPI.   Physical Exam Updated Vital Signs BP 125/82 (BP Location: Left Arm)    Pulse 68    Temp 98.7 F (37.1 C) (Oral)    Resp 13    Ht 5\' 10"  (1.778 m)    Wt 81.6 kg    SpO2 100%    BMI 25.83 kg/m   Physical Exam Vitals signs and nursing note reviewed.  Constitutional:      General: He is not in acute distress.    Appearance: He is well-developed.  HENT:     Head: Normocephalic and atraumatic.  Eyes:     General:        Right eye: No discharge.        Left eye: No discharge.     Conjunctiva/sclera: Conjunctivae normal.  Neck:     Musculoskeletal: Neck supple.  Cardiovascular:     Rate and Rhythm: Normal rate and regular rhythm.     Heart sounds: Normal heart sounds. No murmur. No friction rub. No gallop.   Pulmonary:     Effort: Pulmonary effort is normal. No respiratory distress.     Breath sounds: Normal breath sounds.  Abdominal:  General: There is no distension.     Palpations: Abdomen is soft.     Tenderness: There is no abdominal tenderness.  Musculoskeletal:        General: Tenderness present.     Comments: Right knee grossly normal in appearance.  No significant effusion noted.  Tenderness to palpation over the lateral tibial plateau.  No ligamentous laxity appreciated.  Neurovascular intact.  Skin:    General: Skin is warm and dry.  Neurological:     Mental Status: He is alert.  Psychiatric:        Behavior: Behavior normal.        Thought Content: Thought content normal.      ED Treatments / Results  Labs (all labs ordered are listed, but only abnormal results are displayed) Labs Reviewed - No data to display  EKG None  Radiology Ct Knee Right Wo Contrast  Result Date: 11/24/2019 CLINICAL DATA:  Right knee pain secondary to a fall while roller-skating 2 days ago. EXAM: CT OF THE RIGHT KNEE WITHOUT CONTRAST  TECHNIQUE: Multidetector CT imaging of the right knee was performed according to the standard protocol. Multiplanar CT image reconstructions were also generated. COMPARISON:  Radiographs dated 11/24/2019 FINDINGS: Bones/Joint/Cartilage There is no fracture or dislocation. No joint effusion. Ligaments Suboptimally assessed by CT. The cruciate and collateral ligaments are intact. Muscles and Tendons Normal. Soft tissues Edema in the subcutaneous soft tissues at the anterolateral aspect of the proximal tibia consistent with soft tissue contusion. IMPRESSION: 1. Soft tissue contusion at the anterolateral aspect of the proximal tibia. 2. No fracture or dislocation. Electronically Signed   By: Lorriane Shire M.D.   On: 11/24/2019 08:09   Dg Knee Complete 4 Views Right  Result Date: 11/24/2019 CLINICAL DATA:  Fall roller-skating on Sunday.  Initial encounter. EXAM: RIGHT KNEE - COMPLETE 4+ VIEW COMPARISON:  None. FINDINGS: No evidence of fracture, dislocation, or joint effusion. No evidence of arthropathy or other focal bone abnormality. Soft tissues are unremarkable. IMPRESSION: Negative. Electronically Signed   By: Jonathon  Watts M.D.   On: 11/24/2019 07:06    Procedures Procedures (including critical care time)  Medications Ordered in ED Medications  ibuprofen (ADVIL) tablet 600 mg (has no administration in time range)     Initial Impression / Assessment and Plan / ED Course  I have reviewed the triage vital signs and the nursing notes.  Pertinent labs & imaging results that were available during my care of the patient were reviewed by me and considered in my medical decision making (see chart for details).       23 yM with R knee contusion. Reassuring imaging. PRN NSAIDs. Activity as tolerated.   Final Clinical Impressions(s) / ED Diagnoses   Final diagnoses:  Contusion of right knee, initial encounter    ED Discharge Orders    None       Virgel Manifold, MD 11/24/19 [phone]

## 2020-03-24 ENCOUNTER — Other Ambulatory Visit: Payer: Self-pay

## 2020-03-24 ENCOUNTER — Emergency Department (HOSPITAL_COMMUNITY)
Admission: EM | Admit: 2020-03-24 | Discharge: 2020-03-24 | Disposition: A | Discharge status: Home / Self Care | Payer: Managed Care, Other (non HMO) | Attending: Emergency Medicine | Admitting: Emergency Medicine

## 2020-03-24 ENCOUNTER — Encounter (HOSPITAL_COMMUNITY): Payer: Self-pay | Admitting: Emergency Medicine

## 2020-03-24 ENCOUNTER — Emergency Department (HOSPITAL_COMMUNITY): Payer: Managed Care, Other (non HMO)

## 2020-03-24 DIAGNOSIS — E119 Type 2 diabetes mellitus without complications: Secondary | ICD-10-CM | POA: Diagnosis not present

## 2020-03-24 DIAGNOSIS — S060X0A Concussion without loss of consciousness, initial encounter: Secondary | ICD-10-CM | POA: Diagnosis not present

## 2020-03-24 DIAGNOSIS — Y92410 Unspecified street and highway as the place of occurrence of the external cause: Secondary | ICD-10-CM | POA: Diagnosis not present

## 2020-03-24 DIAGNOSIS — Y93I9 Activity, other involving external motion: Secondary | ICD-10-CM | POA: Diagnosis not present

## 2020-03-24 DIAGNOSIS — S161XXA Strain of muscle, fascia and tendon at neck level, initial encounter: Secondary | ICD-10-CM

## 2020-03-24 DIAGNOSIS — Y999 Unspecified external cause status: Secondary | ICD-10-CM | POA: Diagnosis not present

## 2020-03-24 DIAGNOSIS — J45909 Unspecified asthma, uncomplicated: Secondary | ICD-10-CM | POA: Insufficient documentation

## 2020-03-24 DIAGNOSIS — Z87891 Personal history of nicotine dependence: Secondary | ICD-10-CM | POA: Diagnosis not present

## 2020-03-24 DIAGNOSIS — S199XXA Unspecified injury of neck, initial encounter: Secondary | ICD-10-CM | POA: Diagnosis present

## 2020-03-24 LAB — CBG MONITORING, ED: Glucose-Capillary: 101 mg/dL — ABNORMAL HIGH (ref 70–99)

## 2020-03-24 MED ORDER — METHOCARBAMOL 500 MG PO TABS: 500.0000 mg | ORAL_TABLET | Freq: Two times a day (BID) | ORAL | 0 refills | Status: AC
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
## 2020-03-24 NOTE — ED Notes (Signed)
Discharge paperwork reviewed with pt.  Pt verbalized understanding, ambulatory at time of discharge, NAD.

## 2020-04-07 ENCOUNTER — Ambulatory Visit: Payer: Managed Care, Other (non HMO)

## 2020-06-14 ENCOUNTER — Encounter (HOSPITAL_COMMUNITY): Payer: Self-pay

## 2020-06-14 ENCOUNTER — Other Ambulatory Visit: Payer: Self-pay

## 2020-06-14 ENCOUNTER — Emergency Department (HOSPITAL_COMMUNITY)
Admission: EM | Admit: 2020-06-14 | Discharge: 2020-06-14 | Disposition: A | Discharge status: Home / Self Care | Payer: Managed Care, Other (non HMO) | Attending: Emergency Medicine | Admitting: Emergency Medicine

## 2020-06-14 DIAGNOSIS — Z711 Person with feared health complaint in whom no diagnosis is made: Secondary | ICD-10-CM

## 2020-06-14 DIAGNOSIS — J45909 Unspecified asthma, uncomplicated: Secondary | ICD-10-CM | POA: Insufficient documentation

## 2020-06-14 DIAGNOSIS — Z87891 Personal history of nicotine dependence: Secondary | ICD-10-CM | POA: Insufficient documentation

## 2020-06-14 DIAGNOSIS — E119 Type 2 diabetes mellitus without complications: Secondary | ICD-10-CM | POA: Insufficient documentation

## 2020-06-14 DIAGNOSIS — R369 Urethral discharge, unspecified: Secondary | ICD-10-CM | POA: Insufficient documentation

## 2020-06-14 LAB — URINALYSIS, ROUTINE W REFLEX MICROSCOPIC
Bilirubin Urine: NEGATIVE
Glucose, UA: NEGATIVE mg/dL
Hgb urine dipstick: NEGATIVE
Ketones, ur: NEGATIVE mg/dL
Leukocytes,Ua: NEGATIVE
Nitrite: NEGATIVE
Protein, ur: NEGATIVE mg/dL
Specific Gravity, Urine: 1.018 (ref 1.005–1.030)
pH: 5 (ref 5.0–8.0)

## 2020-06-14 LAB — HIV ANTIBODY (ROUTINE TESTING W REFLEX): HIV Screen 4th Generation wRfx: NONREACTIVE

## 2020-06-14 MED ORDER — LIDOCAINE HCL 2 % IJ SOLN
INTRAMUSCULAR | Status: AC
  Administered 2020-06-14: 400 mg
  Filled 2020-06-14: qty 20

## 2020-06-14 MED ORDER — CEFTRIAXONE SODIUM 1 G IJ SOLR
500.0000 mg | Freq: Once | INTRAMUSCULAR | Status: AC
  Administered 2020-06-14: 500 mg via INTRAMUSCULAR
  Filled 2020-06-14: qty 10

## 2020-06-14 MED ORDER — AZITHROMYCIN 250 MG PO TABS
1000.0000 mg | ORAL_TABLET | Freq: Once | ORAL | Status: AC
  Administered 2020-06-14: 1000 mg via ORAL
  Filled 2020-06-14: qty 4

## 2020-06-14 NOTE — ED Provider Notes (Signed)
[facility]   CSN: [id number] Arrival date & time: 06/14/20  0720     History Chief Complaint  Patient presents with  . Penile Discharge    Cody Navarro is a 24 y.o. male with past medical history significant for asthma, diabetes who presents for evaluation of penile discharge.  Symptom onset this morning.  States he has a yellow discharge from his urethral meatus.  No pain with bowel movements, dysuria, hematuria, rashes or lesions.  Patient states he has had recent unprotected sex with male partners.  Admits to prior history of gonorrhea and chlamydia.  Denies fever, chills, nausea, vomiting, joint swelling, rashes, lesions, testicular pain, testicular swelling, pain with bowel movements.  Denies additional aggravating or  relieving factors.  History obtained from patient and past medical records. No interpretor was used.  HPI     Past Medical History:  Diagnosis Date  . Asthma   . Diabetes mellitus without complication [facility])     Patient Active Problem List   Diagnosis Date Noted  . MDD (major depressive disorder), severe (HCC) 09/21/2018    History reviewed. No pertinent surgical history.     Family History  Problem Relation Age of Onset  . Diabetes Other     Social History   Tobacco Use  . Smoking status: Former Smoker    Packs/day: 0.01    Years: 0.20    Pack years: 0.00    Types: Cigarettes  . Smokeless tobacco: Never Used  Vaping Use  . Vaping Use: Never used  Substance Use Topics  . Alcohol use: Yes  . Drug use: No    Home Medications Prior to Admission medications   Medication Sig Start Date End Date Taking? Authorizing Provider  citalopram (CELEXA) 20 MG tablet Take 1 tablet (20 mg total) by mouth daily. 09/24/18   Antonieta Pert, MD  hydrOXYzine (ATARAX/VISTARIL) 25 MG tablet Take 1 tablet (25 mg total) by mouth every 6 (six) hours as needed (anxiety/agitation or CIWA < or = 10). 09/23/18    Antonieta Pert, MD  methocarbamol (ROBAXIN) 500 MG tablet Take 1 tablet (500 mg total) by mouth 2 (two) times daily. 03/24/20   Benjiman Core, MD  traZODone (DESYREL) 50 MG tablet Take 1 tablet (50 mg total) by mouth at bedtime as needed for sleep. 12/15/18   Molpus, Jonny Ruiz, MD    Allergies    Banana, Food, and Flounder [fish allergy]  Review of Systems   Review of Systems  Constitutional: Negative.   HENT: Negative.   Respiratory: Negative.   Cardiovascular: Negative.   Gastrointestinal: Negative.   Genitourinary: Positive for discharge. Negative for decreased urine volume, difficulty urinating, dysuria, enuresis, flank pain, frequency, hematuria, penile pain, penile swelling, scrotal swelling, testicular pain and urgency.  Musculoskeletal: Negative.   Skin: Negative.   Neurological: Negative.   All other systems reviewed and are negative.   Physical Exam Updated Vital Signs BP 136/87 (BP Location: Left Arm)   Pulse 69   Temp 98.6 F (37 C) (Oral)   Resp 18   SpO2 98%   Physical Exam Vitals and nursing note reviewed.  Constitutional:      General: He is not in acute distress.    Appearance: He is well-developed. He is not ill-appearing, toxic-appearing or diaphoretic.  HENT:     Head: Atraumatic.     Nose: Nose normal.     Mouth/Throat:     Mouth: Mucous membranes are moist.  Eyes:  Pupils: Pupils are equal, round, and reactive to light.  Cardiovascular:     Rate and Rhythm: Normal rate and regular rhythm.     Pulses: Normal pulses.     Heart sounds: Normal heart sounds.  Pulmonary:     Effort: Pulmonary effort is normal. No respiratory distress.     Breath sounds: Normal breath sounds.  Abdominal:     General: Bowel sounds are normal. There is no distension.     Palpations: Abdomen is soft.     Tenderness: There is no abdominal tenderness. There is no right CVA tenderness, left CVA tenderness or guarding.  Genitourinary:    Comments: Declined GU  exam Musculoskeletal:        General: Normal range of motion.     Cervical back: Normal range of motion and neck supple.  Skin:    General: Skin is warm and dry.  Neurological:     Mental Status: He is alert.     ED Results / Procedures / Treatments   Labs (all labs ordered are listed, but only abnormal results are displayed) Labs Reviewed  URINALYSIS, ROUTINE W REFLEX MICROSCOPIC  RPR  HIV ANTIBODY (ROUTINE TESTING W REFLEX)  GC/CHLAMYDIA PROBE AMP ([place]) NOT AT [facility]    EKG None  Radiology No results found.  Procedures Procedures (including critical care time)  Medications Ordered in ED Medications  cefTRIAXone (ROCEPHIN) injection 500 mg (500 mg Intramuscular Given 06/14/20 0818)  azithromycin (ZITHROMAX) tablet 1,000 mg (1,000 mg Oral Given 06/14/20 0818)  lidocaine (XYLOCAINE) 2 % (with pres) injection (400 mg  Given 06/14/20 0818)    ED Course  I have reviewed the triage vital signs and the nursing notes.  Pertinent labs & imaging results that were available during my care of the patient were reviewed by me and considered in my medical decision making (see chart for details).  25 year old presents for evaluation of penile discharge.  Recent unprotected sexual intercourse.  Has history of gonorrhea and chlamydia.  Patient declined GU exam.  Denies any rashes, lesions of testicular pain, swelling, pain with bowel movements.  Patient is afebrile without abdominal tenderness, abdominal pain or painful bowel movements to indicate prostatitis. STD cultures obtained including HIV, syphilis, gonorrhea and chlamydia. Patient to be discharged with instructions to follow up with PCP. Discussed importance of using protection when sexually active. Pt understands that they have GC/Chlamydia cultures pending and that they will need to inform all sexual partners if results return positive. Patient has been treated prophylactically with Azithro and Rocephin. Patient does not want  longer course of Doxy.  UA negative for infection GC, Chlamydia, HIV, RPR pending at dc  The patient has been appropriately medically screened and/or stabilized in the ED. I have low suspicion for any other emergent medical condition which would require further screening, evaluation or treatment in the ED or require inpatient management.  Patient is hemodynamically stable and in no acute distress.  Patient able to ambulate in department prior to ED.  Evaluation does not show acute pathology that would require ongoing or additional emergent interventions while in the emergency department or further inpatient treatment.  I have discussed the diagnosis with the patient and answered all questions.  Pain is been managed while in the emergency department and patient has no further complaints prior to discharge.  Patient is comfortable with plan discussed in room and is stable for discharge at this time.  I have discussed strict return precautions for returning to the  emergency department.  Patient was encouraged to follow-up with PCP/specialist refer to at discharge.     MDM Rules/Calculators/A&P                           Final Clinical Impression(s) / ED Diagnoses Final diagnoses:  Penile discharge  Concern about STD in male without diagnosis    Rx / DC Orders ED Discharge Orders    None       Eulala Newcombe A, PA-C 06/14/20 0907    Terrilee Files, MD 06/14/20 1756

## 2020-06-14 NOTE — Discharge Instructions (Signed)
You were tested for STDs here in the emergency department.  If you were positive you will be called.  You will need to let your partners know.  You were given antibiotics from the emergency department and culture gonorrhea chlamydia test is positive.  If your HIV and syphilis test is positive you will need to seek further treatment.  Return for any new or worsening symptoms.

## 2020-06-14 NOTE — ED Triage Notes (Signed)
Pt presents with c/o penile discharge that started this morning. Pt reports he did have unprotected sex yesterday. Pt reports the discharge is yellow in color.

## 2020-06-15 LAB — RPR: RPR Ser Ql: NONREACTIVE

## 2020-08-05 ENCOUNTER — Other Ambulatory Visit: Payer: Self-pay

## 2020-08-05 ENCOUNTER — Encounter (HOSPITAL_COMMUNITY): Payer: Self-pay | Admitting: Emergency Medicine

## 2020-08-05 ENCOUNTER — Emergency Department (HOSPITAL_COMMUNITY)
Admission: EM | Admit: 2020-08-05 | Discharge: 2020-08-05 | Disposition: A | Discharge status: Home / Self Care | Payer: Self-pay | Attending: Emergency Medicine | Admitting: Emergency Medicine

## 2020-08-05 DIAGNOSIS — E119 Type 2 diabetes mellitus without complications: Secondary | ICD-10-CM | POA: Insufficient documentation

## 2020-08-05 DIAGNOSIS — Z20822 Contact with and (suspected) exposure to covid-19: Secondary | ICD-10-CM | POA: Insufficient documentation

## 2020-08-05 DIAGNOSIS — J45909 Unspecified asthma, uncomplicated: Secondary | ICD-10-CM | POA: Insufficient documentation

## 2020-08-05 DIAGNOSIS — B9789 Other viral agents as the cause of diseases classified elsewhere: Secondary | ICD-10-CM | POA: Insufficient documentation

## 2020-08-05 DIAGNOSIS — J028 Acute pharyngitis due to other specified organisms: Secondary | ICD-10-CM | POA: Insufficient documentation

## 2020-08-05 DIAGNOSIS — J029 Acute pharyngitis, unspecified: Secondary | ICD-10-CM

## 2020-08-05 DIAGNOSIS — Z87891 Personal history of nicotine dependence: Secondary | ICD-10-CM | POA: Insufficient documentation

## 2020-08-05 LAB — SARS CORONAVIRUS 2 BY RT PCR (HOSPITAL ORDER, PERFORMED IN ~~LOC~~ HOSPITAL LAB): SARS Coronavirus 2: NEGATIVE

## 2020-08-05 LAB — GROUP A STREP BY PCR: Group A Strep by PCR: NOT DETECTED

## 2020-08-05 MED ORDER — ACETAMINOPHEN 500 MG PO TABS: 500.0000 mg | ORAL_TABLET | Freq: Four times a day (QID) | ORAL | 0 refills | Status: AC | PRN

## 2020-08-05 MED ORDER — CHLORHEXIDINE GLUCONATE 0.12 % MT SOLN: 15.0000 mL | Freq: Two times a day (BID) | OROMUCOSAL | 0 refills | Status: AC

## 2020-08-05 NOTE — ED Provider Notes (Signed)
[facility] Provider Note   CSN: [id number] Arrival date & time: 08/05/20  1049     History Chief Complaint  Patient presents with  . Sore Throat    Cody Navarro is a 24 y.o. male.  The history is provided by the patient. No language interpreter was used.  Sore Throat Pertinent negatives include no abdominal pain and no shortness of breath.     24 year old male presenting complaining of sore throat.  Patient developed sore throat since yesterday.  Described as an irritation worse with swallowing, with symptoms tends to progress throughout the day today prompting this ER visit.  He describes symptoms as mild to moderate.  He does not complain of any fever, chills, congestion, ear pain, chest pain, trouble breathing, coughing, loss of taste or smell, nausea vomiting diarrhea or rash.  No complaint of any abdominal pain.  He has been vaccinated for COVID-19 with Moderna in May.  He denies any specific treatment tried.  Past Medical History:  Diagnosis Date  . Asthma   . Diabetes mellitus without complication [facility])     Patient Active Problem List   Diagnosis Date Noted  . MDD (major depressive disorder), severe (HCC) 09/21/2018    History reviewed. No pertinent surgical history.     Family History  Problem Relation Age of Onset  . Diabetes Other     Social History   Tobacco Use  . Smoking status: Former Smoker    Packs/day: 0.01    Years: 0.20    Pack years: 0.00    Types: Cigarettes  . Smokeless tobacco: Never Used  Vaping Use  . Vaping Use: Never used  Substance Use Topics  . Alcohol use: Yes  . Drug use: No    Home Medications Prior to Admission medications   Medication Sig Start Date End Date Taking? Authorizing Provider  citalopram (CELEXA) 20 MG tablet Take 1 tablet (20 mg total) by mouth daily. 09/24/18   Antonieta Pert, MD  hydrOXYzine (ATARAX/VISTARIL) 25 MG tablet Take 1 tablet (25 mg total) by mouth every 6  (six) hours as needed (anxiety/agitation or CIWA < or = 10). 09/23/18   Antonieta Pert, MD  methocarbamol (ROBAXIN) 500 MG tablet Take 1 tablet (500 mg total) by mouth 2 (two) times daily. 03/24/20   Benjiman Core, MD  traZODone (DESYREL) 50 MG tablet Take 1 tablet (50 mg total) by mouth at bedtime as needed for sleep. 12/15/18   Molpus, Jonny Ruiz, MD    Allergies    Banana, Food, and Flounder [fish allergy]  Review of Systems   Review of Systems  Constitutional: Negative for fever.  HENT: Positive for sore throat.   Respiratory: Negative for shortness of breath.   Gastrointestinal: Negative for abdominal pain.    Physical Exam Updated Vital Signs BP (!) 134/96 (BP Location: Left Arm)   Pulse 70   Temp 98 F (36.7 C) (Oral)   Resp 16   SpO2 94%   Physical Exam Vitals and nursing note reviewed.  Constitutional:      General: He is not in acute distress.    Appearance: He is well-developed.  HENT:     Head: Atraumatic.     Right Ear: Tympanic membrane normal.     Left Ear: Tympanic membrane normal.     Nose: No congestion.     Mouth/Throat:     Mouth: Mucous membranes are moist.     Pharynx: Uvula midline. Posterior oropharyngeal erythema present. No pharyngeal  swelling or uvula swelling.     Tonsils: No tonsillar exudate or tonsillar abscesses. 1+ on the right. 1+ on the left.  Eyes:     Conjunctiva/sclera: Conjunctivae normal.  Cardiovascular:     Rate and Rhythm: Normal rate and regular rhythm.     Heart sounds: Normal heart sounds. No murmur heard.  No friction rub. No gallop.   Pulmonary:     Effort: Pulmonary effort is normal.     Breath sounds: No wheezing, rhonchi or rales.  Abdominal:     Tenderness: There is no abdominal tenderness.  Musculoskeletal:     Cervical back: Neck supple.  Lymphadenopathy:     Cervical: No cervical adenopathy.  Skin:    Findings: No rash.  Neurological:     Mental Status: He is alert.  Psychiatric:        Mood and Affect:  Mood normal.     ED Results / Procedures / Treatments   Labs (all labs ordered are listed, but only abnormal results are displayed) Labs Reviewed  GROUP A STREP BY PCR  SARS CORONAVIRUS 2 BY RT PCR (HOSPITAL ORDER, PERFORMED IN [facility])    EKG None  Radiology No results found.  Procedures Procedures (including critical care time)  Medications Ordered in ED Medications - No data to display  ED Course  I have reviewed the triage vital signs and the nursing notes.  Pertinent labs & imaging results that were available during my care of the patient were reviewed by me and considered in my medical decision making (see chart for details).    MDM Rules/Calculators/A&P                          BP (!) 134/96 (BP Location: Left Arm)   Pulse 70   Temp 98 F (36.7 C) (Oral)   Resp 16   SpO2 94%   Final Clinical Impression(s) / ED Diagnoses Final diagnoses:  Viral pharyngitis  Suspected COVID-19 virus infection    Rx / DC Orders ED Discharge Orders         Ordered    chlorhexidine (PERIDEX) 0.12 % solution  2 times daily        08/05/20 1407    acetaminophen (TYLENOL) 500 MG tablet  Every 6 hours PRN        08/05/20 1407        For 1:18 PM Pt here with sore throat.  Throat exam with mild erythema without evidence of PTA or evidence of deep tissue infection.  No airway impairment.  He has been vaccinated for COVID-19.  Will check strep test and covid-19 test.   2:10 PM Great strep test is negative, COVID-19 test is currently pending, patient otherwise is stable to be discharged and can follow-up on Covid results through MyChart.  Symptomatic treatment provided.  Return precaution discussed.  Cody Navarro was evaluated in Emergency Department on 08/05/2020 for the symptoms described in the history of present illness. He was evaluated in the context of the global COVID-19 pandemic, which necessitated consideration that the patient might be at risk for  infection with the SARS-CoV-2 virus that causes COVID-19. Institutional protocols and algorithms that pertain to the evaluation of patients at risk for COVID-19 are in a state of rapid change based on information released by regulatory bodies including the CDC and federal and state organizations. These policies and algorithms were followed during the patient's care in the ED.  Fayrene Helper, PA-C 08/05/20 1410    Curatolo, Adam, DO 08/05/20 1452

## 2020-08-05 NOTE — Discharge Instructions (Signed)
You have been evaluated for your sore throat.  Your strep test is negative therefore you do not have a bacterial throat infection.  Your Covid test is currently pending, please check up on result through MyChart within the next 24 hours.  If your test is positive, follow instruction below.

## 2020-08-05 NOTE — ED Triage Notes (Signed)
Per patient,states throat pain since yesterday

## 2020-09-06 IMAGING — CR DG FINGER LITTLE 2+V*R*
3 series · 3 of 3 positions shown · non-contrast
Comparison: None.

CLINICAL DATA: Punched a window with laceration.

EXAM:
RIGHT LITTLE FINGER 2+V

[x finger pa right]
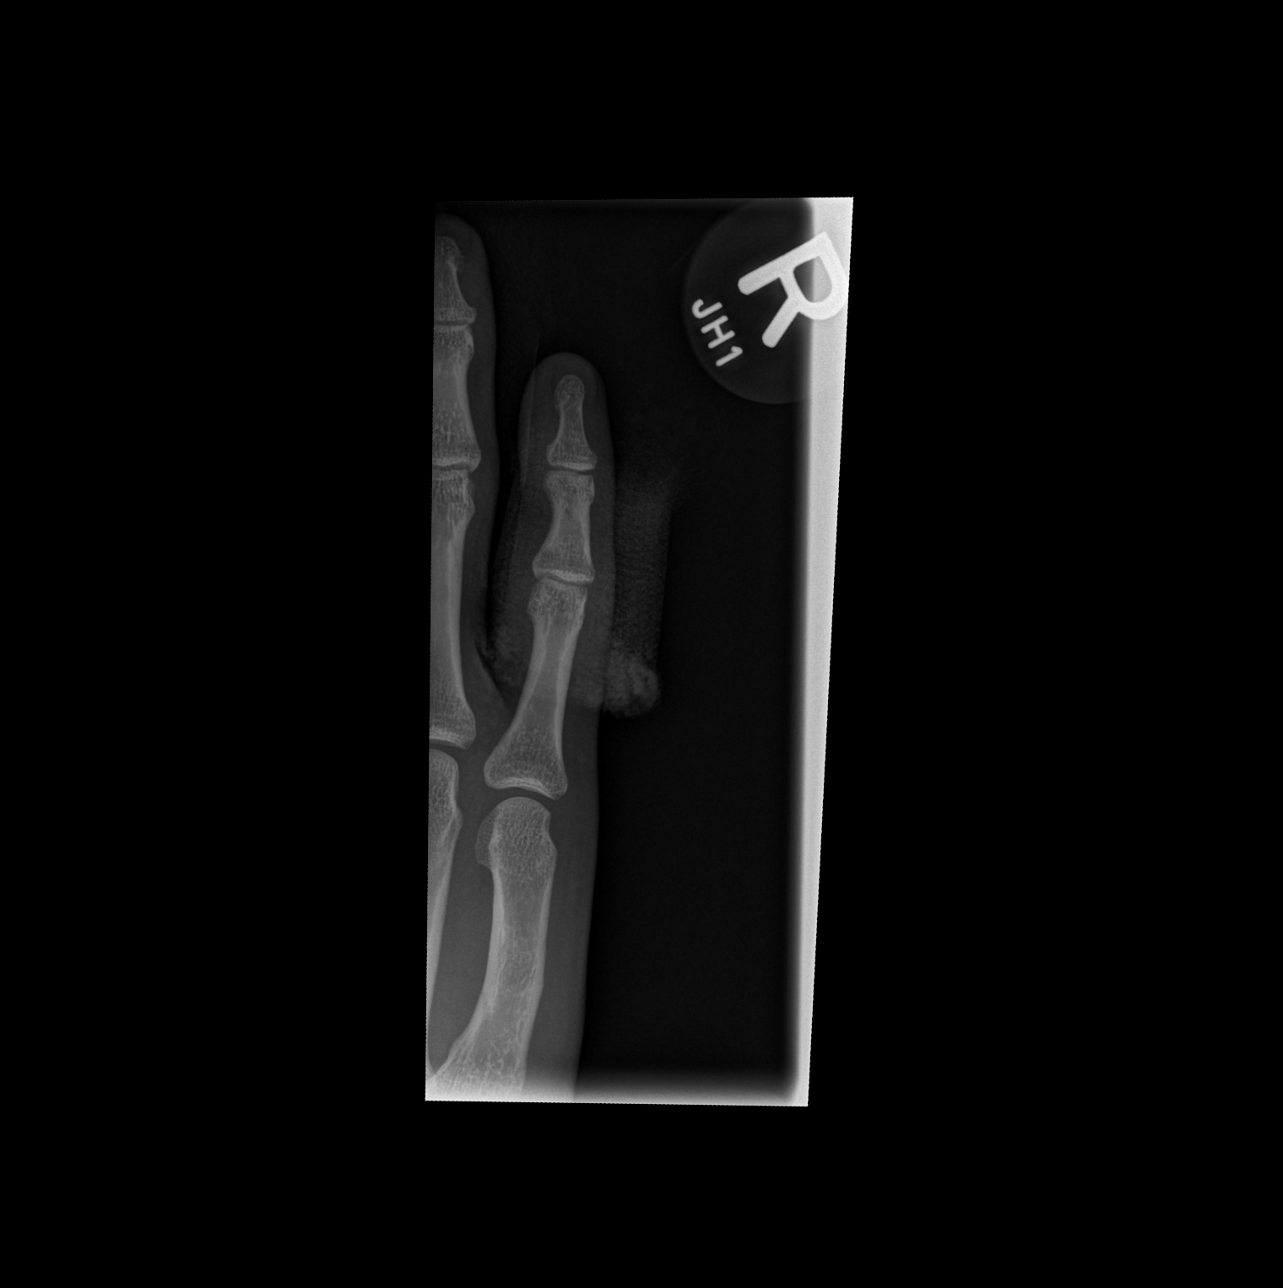

[x finger obl right]
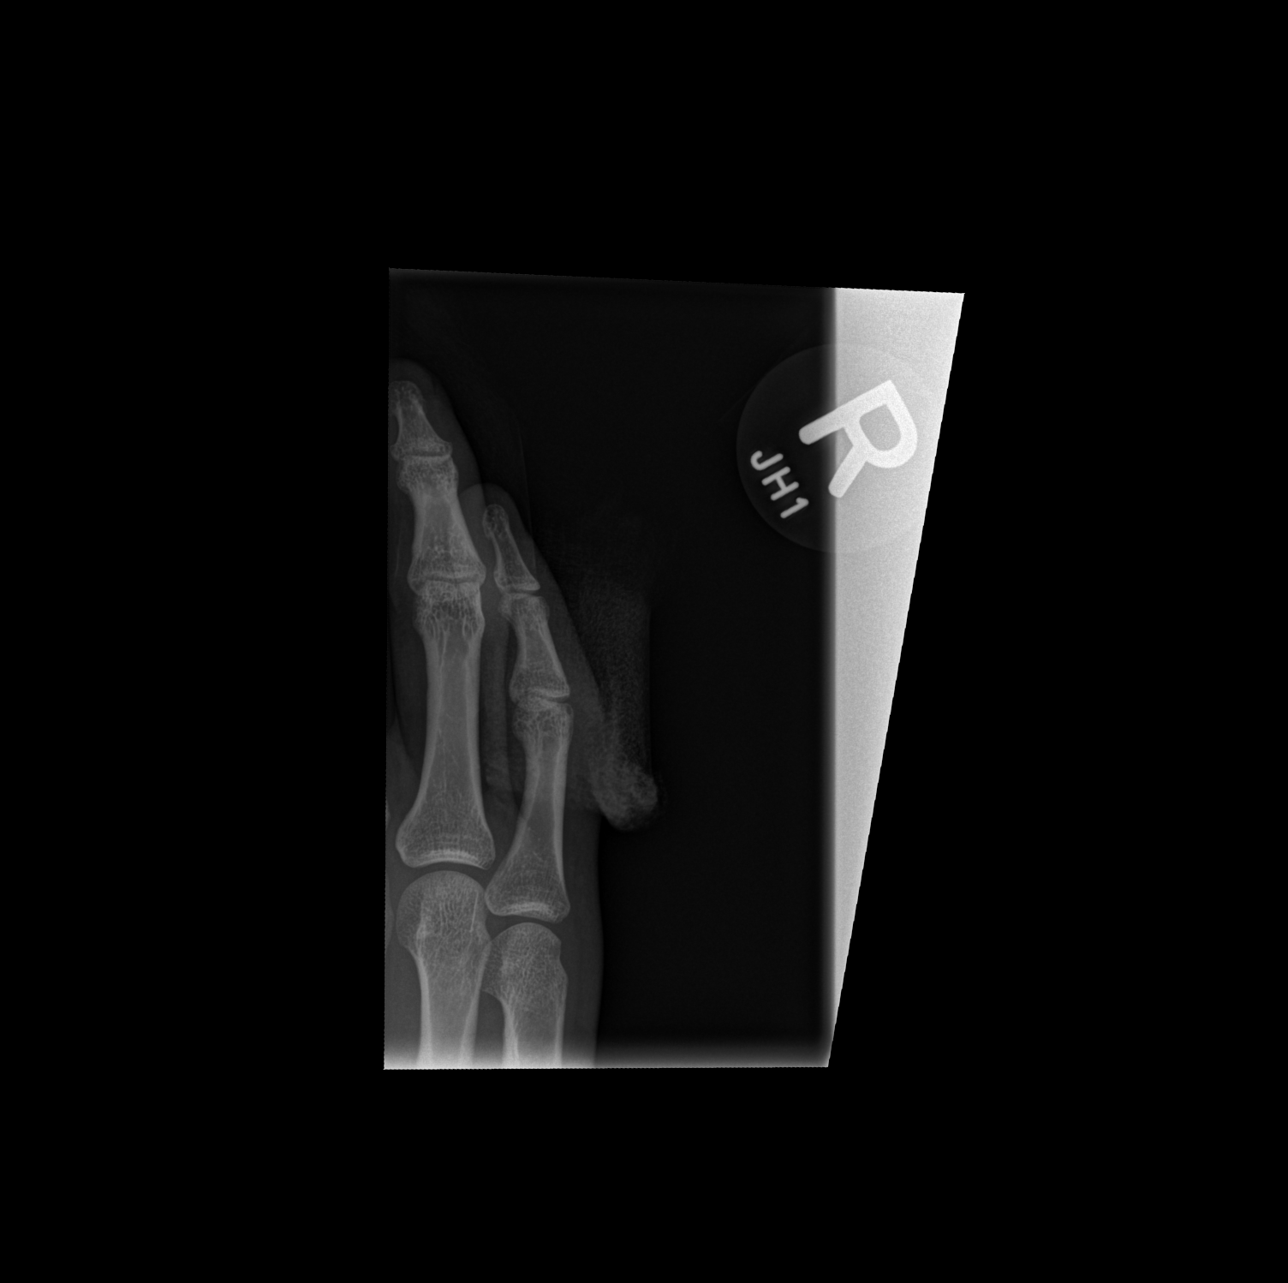

[x finger lat right]
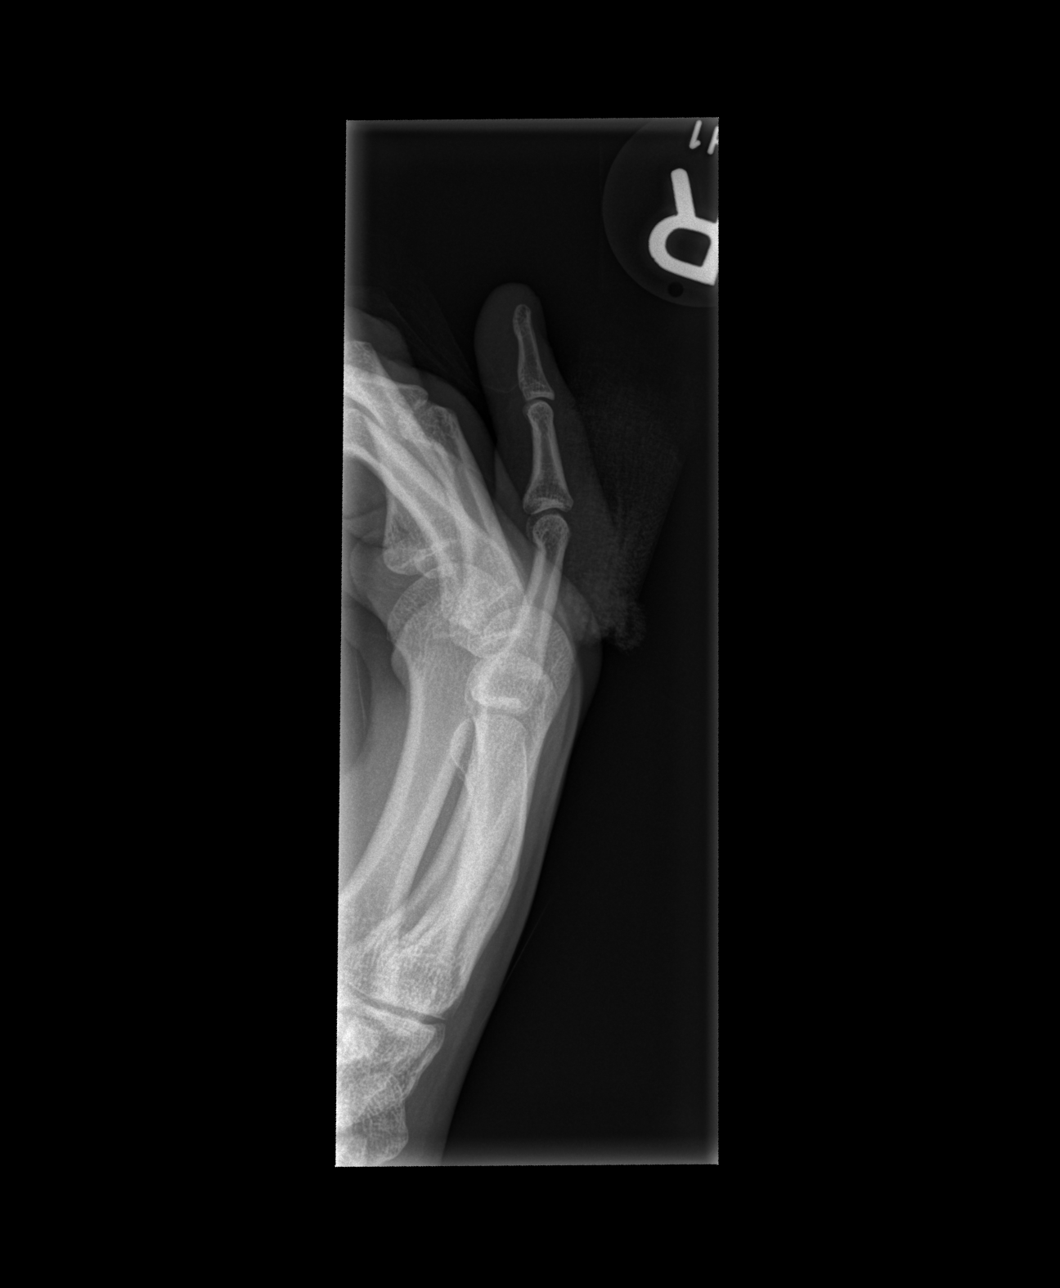

[3 of 3 positions shown; findings below may reference images not displayed]

FINDINGS: There is no evidence of fracture or dislocation. There is no
evidence of arthropathy or other focal bone abnormality. A dressing
overlies the mid digit. No radiopaque foreign body or obvious soft
tissue air..
IMPRESSION: 1. No osseous abnormality.
2. A dressing overlies the mid finger without radiopaque foreign
body or gross soft tissue air.

## 2021-05-30 ENCOUNTER — Other Ambulatory Visit: Payer: Self-pay

## 2021-05-30 ENCOUNTER — Emergency Department (HOSPITAL_COMMUNITY)
Admission: EM | Admit: 2021-05-30 | Discharge: 2021-05-30 | Disposition: A | Discharge status: Home / Self Care | Payer: Managed Care, Other (non HMO) | Attending: Emergency Medicine | Admitting: Emergency Medicine

## 2021-05-30 ENCOUNTER — Encounter (HOSPITAL_COMMUNITY): Payer: Self-pay | Admitting: Emergency Medicine

## 2021-05-30 DIAGNOSIS — Z760 Encounter for issue of repeat prescription: Secondary | ICD-10-CM | POA: Diagnosis not present

## 2021-05-30 DIAGNOSIS — Z202 Contact with and (suspected) exposure to infections with a predominantly sexual mode of transmission: Secondary | ICD-10-CM | POA: Insufficient documentation

## 2021-05-30 DIAGNOSIS — Z79899 Other long term (current) drug therapy: Secondary | ICD-10-CM | POA: Insufficient documentation

## 2021-05-30 DIAGNOSIS — Z87891 Personal history of nicotine dependence: Secondary | ICD-10-CM | POA: Insufficient documentation

## 2021-05-30 DIAGNOSIS — J45909 Unspecified asthma, uncomplicated: Secondary | ICD-10-CM | POA: Diagnosis not present

## 2021-05-30 DIAGNOSIS — E119 Type 2 diabetes mellitus without complications: Secondary | ICD-10-CM | POA: Diagnosis not present

## 2021-05-30 NOTE — Discharge Instructions (Addendum)
Please read the attachment on genital herpes.  It simply sounds as though you may have had a potential exposure.  The reason why is a potential is because you were not truly exposed unless she was having a breakout when you engaged in intercourse.  I am NOT diagnosing you with genital herpes and instead have low suspicion at this time, particularly given that you have never broken out in a penile rash.  As we discussed, it can lay dormant and could potentially break out at a later date.  However, blood testing is unreliable and not recommended per CDC guidelines.  There is also no wounds for me to biopsy/culture.  No clinical signs or symptoms.  Please follow-up with your psychiatrist on 06/01/2021 for ongoing evaluation and management of your anxiety.  If you develop any suicidal ideation, homicidal ideation, severe depression, or any hallucinations, however free to return to the ED or seek immediate medical attention.

## 2021-05-30 NOTE — ED Triage Notes (Addendum)
Pt states a past sexual partner let him know she was positive for HSV and he would like get tested. Denies discharge, burning during urination, or changes in urine. Pt also requesting refill on his antidepressant medication.

## 2021-05-30 NOTE — ED Provider Notes (Signed)
[facility] Provider Note   CSN: [id number] Arrival date & time: 05/30/21  1850     History Chief Complaint  Patient presents with   Exposure to STD   Medication Refill    Chrles Selley is a 25 y.o. male with PMH of anxiety presents the ED with concern for HSV exposure.  He endorses multiple sexual partners in the past year, he usually does not use protection.  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  He is here in the ED because he is currently getting a new relationship and would like to be responsible.  He is hoping that he could obtain testing to see if he is also HSV-2 positive.  He denies any dysuria, increased urinary frequency, testicular pain or swelling, pain with defecation, overlying skin changes, rash, abdominal pain, or any other symptoms.  He also states that the thought of possibly having HSV-2 has been contributing to worsening anxiety symptoms.  He used to be on Celexa.  He has not been on it for months, but he was hoping that we could start him back on it.  He has an appointment with a psychiatrist on 06/01/2021.  HPI     Past Medical History:  Diagnosis Date   Asthma    Diabetes mellitus without complication [facility])     Patient Active Problem List   Diagnosis Date Noted   MDD (major depressive disorder), severe (HCC) 09/21/2018    History reviewed. No pertinent surgical history.     Family History  Problem Relation Age of Onset   Diabetes Other     Social History   Tobacco Use   Smoking status: Former    Packs/day: 0.01    Years: 0.20    Pack years: 0.00    Types: Cigarettes   Smokeless tobacco: Never  Vaping Use   Vaping Use: Never used  Substance Use Topics   Alcohol use: Yes   Drug use: No    Home Medications Prior to Admission medications   Medication Sig Start Date End Date Taking? Authorizing Provider  acetaminophen (TYLENOL) 500 MG tablet Take 1 tablet (500 mg  total) by mouth every 6 (six) hours as needed. 08/05/20   Fayrene Helper, PA-C  chlorhexidine (PERIDEX) 0.12 % solution Use as directed 15 mLs in the mouth or throat 2 (two) times daily. 08/05/20   Fayrene Helper, PA-C  citalopram (CELEXA) 20 MG tablet Take 1 tablet (20 mg total) by mouth daily. 09/24/18   Antonieta Pert, MD  hydrOXYzine (ATARAX/VISTARIL) 25 MG tablet Take 1 tablet (25 mg total) by mouth every 6 (six) hours as needed (anxiety/agitation or CIWA < or = 10). 09/23/18   Antonieta Pert, MD  methocarbamol (ROBAXIN) 500 MG tablet Take 1 tablet (500 mg total) by mouth 2 (two) times daily. 03/24/20   Benjiman Core, MD  traZODone (DESYREL) 50 MG tablet Take 1 tablet (50 mg total) by mouth at bedtime as needed for sleep. 12/15/18   Molpus, Jonny Ruiz, MD    Allergies    Banana, Food, and Flounder [fish allergy]  Review of Systems   Review of Systems  All other systems reviewed and are negative.  Physical Exam Updated Vital Signs BP 133/90 (BP Location: Right Arm)   Pulse 61   Temp 98.8 F (37.1 C) (Oral)   Resp 16   SpO2 99%   Physical Exam Vitals and nursing note reviewed. Exam conducted with a chaperone present.  Constitutional:      General: He is not in acute distress.    Appearance: Normal appearance. He is not ill-appearing.  HENT:     Head: Normocephalic and atraumatic.  Eyes:     General: No scleral icterus.    Conjunctiva/sclera: Conjunctivae normal.  Cardiovascular:     Rate and Rhythm: Normal rate.  Pulmonary:     Effort: Pulmonary effort is normal. No respiratory distress.  Genitourinary:    Comments: Deferred GU exam. Skin:    General: Skin is dry.  Neurological:     Mental Status: He is alert.     GCS: GCS eye subscore is 4. GCS verbal subscore is 5. GCS motor subscore is 6.  Psychiatric:        Mood and Affect: Mood normal.        Behavior: Behavior normal.        Thought Content: Thought content normal.    ED Results / Procedures / Treatments    Labs (all labs ordered are listed, but only abnormal results are displayed) Labs Reviewed - No data to display  EKG None  Radiology No results found.  Procedures Procedures   Medications Ordered in ED Medications - No data to display  ED Course  I have reviewed the triage vital signs and the nursing notes.  Pertinent labs & imaging results that were available during my care of the patient were reviewed by me and considered in my medical decision making (see chart for details).    MDM Rules/Calculators/A&P                          Alim Cattell was evaluated in Emergency Department on 05/30/2021 for the symptoms described in the history of present illness. He was evaluated in the context of the global COVID-19 pandemic, which necessitated consideration that the patient might be at risk for infection with the SARS-CoV-2 virus that causes COVID-19. Institutional protocols and algorithms that pertain to the evaluation of patients at risk for COVID-19 are in a state of rapid change based on information released by regulatory bodies including the CDC and federal and state organizations. These policies and algorithms were followed during the patient's care in the ED.  I personally reviewed patient's medical chart and all notes from triage and staff during today's encounter. I have also ordered and reviewed all labs and imaging that I felt to be medically necessary in the evaluation of this patient's complaints and with consideration of their physical exam. If needed, translation services were available and utilized.   Patient in the ED requesting blood testing for HSV-2 exposure.  He states that somebody with whom he was sexually active tested positive and he would like to be responsible before proceeding with new sexual relationship.  He denies ever having had any penile rash or vesicular lesions.  We discussed how HSV-2 is transmitted as well as [place] my appointment.  There is no sore  for me to culture or clinically diagnosis.  He deferred GU exam which is reasonable given that he is entirely without any GU, abdominal, or other symptoms.  He simply feels anxious because he does not want to be potentially exposing a new sexual partner to HSV-2.  However, he denies any SI/HI/AVH/other concerning psychiatric history.  He was simply hoping that he can fill his Celexa earlier given that he has not been on it for a couple of months and this new potential STI exposure is giving  him stress.  He is seeing a psychiatrist in 2 days and I told him that I did not want to initiate him on Celexa given that he can have poor response in the first couple of days, particularly in context of increased anxiety.  I offered hydroxyzine to take as needed x6 hours, but he declined.  We discussed that blood testing for HSV-2 yields many false positives and given lack of any symptoms, it would not be recommended by CDC guidelines.  He understands.  He also agrees that no further testing is warranted.  Emphasized importance of outpatient follow-up with her primary care provider.  ER return precautions discussed.  Patient voices understanding and is agreeable to the plan.  Final Clinical Impression(s) / ED Diagnoses Final diagnoses:  STD exposure    Rx / DC Orders ED Discharge Orders     None        Elvera Maria 05/30/21 2041    Lorre Nick, MD 06/02/21 1521

## 2022-03-14 ENCOUNTER — Emergency Department (HOSPITAL_COMMUNITY)
Admission: EM | Admit: 2022-03-14 | Discharge: 2022-03-14 | Disposition: A | Discharge status: Home / Self Care | Payer: Managed Care, Other (non HMO) | Attending: Emergency Medicine | Admitting: Emergency Medicine

## 2022-03-14 ENCOUNTER — Other Ambulatory Visit: Payer: Self-pay

## 2022-03-14 ENCOUNTER — Encounter (HOSPITAL_COMMUNITY): Payer: Self-pay

## 2022-03-14 DIAGNOSIS — K029 Dental caries, unspecified: Secondary | ICD-10-CM | POA: Insufficient documentation

## 2022-03-14 MED ORDER — PENICILLIN V POTASSIUM 500 MG PO TABS
500.0000 mg | ORAL_TABLET | Freq: Once | ORAL | Status: AC
  Administered 2022-03-14: 500 mg via ORAL
  Filled 2022-03-14: qty 1

## 2022-03-14 MED ORDER — PENICILLIN V POTASSIUM 500 MG PO TABS: 500.0000 mg | ORAL_TABLET | Freq: Four times a day (QID) | ORAL | 0 refills | Status: AC

## 2022-03-14 MED ORDER — IBUPROFEN 200 MG PO TABS
400.0000 mg | ORAL_TABLET | Freq: Once | ORAL | Status: AC
  Administered 2022-03-14: 400 mg via ORAL
  Filled 2022-03-14: qty 2

## 2022-03-14 NOTE — ED Triage Notes (Signed)
Patient BIB EMS with c/o right upper dental pain since Sunday. Has been taking Naproxen for pain. Endorses hot flashes, chills, weakness.  ?

## 2022-03-14 NOTE — ED Provider Notes (Signed)
?[facility]-EMERGENCY DEPT ?Provider Note ? ? ?CSN: [id number] ?Arrival date & time: 03/14/22  0130 ? ?  ? ?History ? ?Chief Complaint  ?Patient presents with  ? Dental Pain  ? ? ?Cody Navarro is a 26 y.o. male. ? ?The history is provided by the patient.  ?Dental Pain ?Location:  Lower ?Lower teeth location:  30/RL 1st molar, 31/RL 2nd molar and 29/RL 2nd bicuspid ?Quality:  Aching ?Severity:  Severe ?Onset quality:  Gradual ?Duration: days. ?Timing:  Constant ?Progression:  Worsening ?Chronicity:  Recurrent ?Context: dental caries, dental fracture and poor dentition   ?Previous work-up:  Dental exam ?Relieved by:  Nothing ?Worsened by:  Nothing ?Ineffective treatments:  None tried ?Associated symptoms: no difficulty swallowing, no facial swelling, no fever, no gum swelling, no neck swelling, no oral bleeding and no trismus   ?Risk factors: no diabetes   ? ?  ? ?Home Medications ?Prior to Admission medications   ?Medication Sig Start Date End Date Taking? Authorizing Provider  ?penicillin v potassium (VEETID) 500 MG tablet Take 1 tablet (500 mg total) by mouth 4 (four) times daily for 7 days. 03/14/22 03/21/22 Yes Caryssa Elzey, MD  ?acetaminophen (TYLENOL) 500 MG tablet Take 1 tablet (500 mg total) by mouth every 6 (six) hours as needed. 08/05/20   Fayrene Helper, PA-C  ?chlorhexidine (PERIDEX) 0.12 % solution Use as directed 15 mLs in the mouth or throat 2 (two) times daily. 08/05/20   Fayrene Helper, PA-C  ?citalopram (CELEXA) 20 MG tablet Take 1 tablet (20 mg total) by mouth daily. 09/24/18   Antonieta Pert, MD  ?hydrOXYzine (ATARAX/VISTARIL) 25 MG tablet Take 1 tablet (25 mg total) by mouth every 6 (six) hours as needed (anxiety/agitation or CIWA < or = 10). 09/23/18   Antonieta Pert, MD  ?methocarbamol (ROBAXIN) 500 MG tablet Take 1 tablet (500 mg total) by mouth 2 (two) times daily. 03/24/20   Benjiman Core, MD  ?traZODone (DESYREL) 50 MG tablet Take 1 tablet (50 mg total) by mouth at  bedtime as needed for sleep. 12/15/18   Molpus, Jonny Ruiz, MD  ?   ? ?Allergies    ?Banana, Food, and Flounder [fish allergy]   ? ?Review of Systems   ?Review of Systems  ?Constitutional:  Negative for fever.  ?HENT:  Positive for dental problem. Negative for facial swelling.   ?Eyes:  Negative for redness.  ?Respiratory:  Negative for wheezing and stridor.   ?Cardiovascular:  Negative for chest pain.  ?Gastrointestinal:  Negative for abdominal pain.  ?Genitourinary:  Negative for difficulty urinating.  ?Neurological:  Negative for facial asymmetry.  ?Psychiatric/Behavioral:  Negative for agitation.   ?All other systems reviewed and are negative. ? ?Physical Exam ?Updated Vital Signs ?BP (!) 161/113 (BP Location: Left Arm)   Pulse (!) 58   Temp 97.9 ?F (36.6 ?C) (Oral)   Resp 12   Ht 5\' 10"  (1.778 m)   Wt 81.6 kg   SpO2 99%   BMI 25.81 kg/m?  ?Physical Exam ?Vitals and nursing note reviewed.  ?Constitutional:   ?   General: He is not in acute distress. ?   Appearance: Normal appearance.  ?HENT:  ?   Head: Normocephalic and atraumatic.  ?   Nose: Nose normal.  ?Eyes:  ?   Conjunctiva/sclera: Conjunctivae normal.  ?   Pupils: Pupils are equal, round, and reactive to light.  ?Cardiovascular:  ?   Rate and Rhythm: Normal rate and regular rhythm.  ?   Pulses: Normal  pulses.  ?   Heart sounds: Normal heart sounds.  ?Pulmonary:  ?   Effort: Pulmonary effort is normal.  ?   Breath sounds: Normal breath sounds.  ?Abdominal:  ?   General: Bowel sounds are normal.  ?   Palpations: Abdomen is soft.  ?   Tenderness: There is no abdominal tenderness. There is no guarding.  ?Musculoskeletal:     ?   General: Normal range of motion.  ?   Cervical back: Normal range of motion and neck supple.  ?Skin: ?   General: Skin is warm and dry.  ?   Capillary Refill: Capillary refill takes less than 2 seconds.  ?Neurological:  ?   General: No focal deficit present.  ?   Mental Status: He is alert and oriented to person, place, and time.  ?    Deep Tendon Reflexes: Reflexes normal.  ?Psychiatric:     ?   Mood and Affect: Mood normal.     ?   Behavior: Behavior normal.  ? ? ?ED Results / Procedures / Treatments   ?Labs ?(all labs ordered are listed, but only abnormal results are displayed) ?Labs Reviewed - No data to display ? ?EKG ?None ? ?Radiology ?No results found. ? ?Procedures ?Procedures  ? ? ?Medications Ordered in ED ?Medications  ?penicillin v potassium (VEETID) tablet 500 mg (500 mg Oral Given 03/14/22 0154)  ?ibuprofen (ADVIL) tablet 400 mg (400 mg Oral Given 03/14/22 0154)  ? ? ?ED Course/ Medical Decision Making/ A&P ?  ?                        ?Medical Decision Making ?Dental caries, not followed up with a dentist  ? ?Problems Addressed: ?Dental caries: acute illness or injury ?   Details: antibiotics started ? ?Amount and/or Complexity of Data Reviewed ?Independent Historian: friend ?   Details: see above ?External Data Reviewed: notes. ?   Details: previous Ed notes ? ?Risk ?OTC drugs. ?Prescription drug management. ?Risk Details: Patient will need to be seen by dentistry for ongoing issues with dental caries.  PCN prescribed.  Alternate tylenol and ibuprofen  ? ? ?Final Clinical Impression(s) / ED Diagnoses ?Final diagnoses:  ?Dental caries  ?Return for intractable cough, coughing up blood, fevers > 100.4 unrelieved by medication, shortness of breath, intractable vomiting, chest pain, shortness of breath, weakness, numbness, changes in speech, facial asymmetry, abdominal pain, passing out, Inability to tolerate liquids or food, cough, altered mental status or any concerns. No signs of systemic illness or infection. The patient is nontoxic-appearing on exam and vital signs are within normal limits.  ?I have reviewed the triage vital signs and the nursing notes. Pertinent labs & imaging results that were available during my care of the patient were reviewed by me and considered in my medical decision making (see chart for details). After  history, exam, and medical workup I feel the patient has been appropriately medically screened and is safe for discharge home. Pertinent diagnoses were discussed with the patient. Patient was given return precautions.  ?  ?  ? ?Rx / DC Orders ?ED Discharge Orders   ? ?      Ordered  ?  penicillin v potassium (VEETID) 500 MG tablet  4 times daily       ? 03/14/22 0214  ? ?  ?  ? ?  ? ? ?  ?Raelea Gosse, MD ?03/14/22 0217 ? ?

## 2022-04-11 ENCOUNTER — Encounter (HOSPITAL_COMMUNITY): Payer: Self-pay

## 2022-04-11 ENCOUNTER — Emergency Department (HOSPITAL_COMMUNITY)
Admission: EM | Admit: 2022-04-11 | Discharge: 2022-04-11 | Disposition: A | Discharge status: Home / Self Care | Payer: Self-pay | Attending: Emergency Medicine | Admitting: Emergency Medicine

## 2022-04-11 ENCOUNTER — Other Ambulatory Visit: Payer: Self-pay

## 2022-04-11 ENCOUNTER — Emergency Department (HOSPITAL_COMMUNITY): Payer: Self-pay

## 2022-04-11 DIAGNOSIS — X509XXA Other and unspecified overexertion or strenuous movements or postures, initial encounter: Secondary | ICD-10-CM | POA: Insufficient documentation

## 2022-04-11 DIAGNOSIS — R001 Bradycardia, unspecified: Secondary | ICD-10-CM | POA: Insufficient documentation

## 2022-04-11 DIAGNOSIS — S46911A Strain of unspecified muscle, fascia and tendon at shoulder and upper arm level, right arm, initial encounter: Secondary | ICD-10-CM | POA: Insufficient documentation

## 2022-04-11 MED ORDER — IBUPROFEN 800 MG PO TABS
800.0000 mg | ORAL_TABLET | Freq: Once | ORAL | Status: AC
  Administered 2022-04-11: 800 mg via ORAL
  Filled 2022-04-11: qty 1

## 2022-04-11 NOTE — Discharge Instructions (Addendum)
If you develop numbness or weakness in the arm, fevers, intractable pain, or any other new/concerning symptoms then return to the ER for evaluation. ?

## 2022-04-11 NOTE — ED Provider Notes (Signed)
?[facility]-EMERGENCY DEPT ?Provider Note ? ? ?CSN: [id number] ?Arrival date & time: 04/11/22  7425 ? ?  ? ?History ? ?Chief Complaint  ?Patient presents with  ? Shoulder Pain  ? ? ?Cody Navarro is a 26 y.o. male. ? ?HPI ?26 year old male presents with acute right shoulder pain.  Started all of a sudden this morning when he woke up.  His arm felt numb to the anterior aspect of his shoulder and clavicle area.  Went back to bed but then woke up and the pain is still there.  Has a hard time moving his shoulder due to the pain.  No chest pain or shortness of breath.  No fever or recent injury.  Felt a little better with some range of motion exercises.  The numbness seems to be gone.  He did not take anything for the pain. He's not sure if he slept on it wrong. ? ?Home Medications ?Prior to Admission medications   ?Medication Sig Start Date End Date Taking? Authorizing Provider  ?acetaminophen (TYLENOL) 500 MG tablet Take 1 tablet (500 mg total) by mouth every 6 (six) hours as needed. 08/05/20   Fayrene Helper, PA-C  ?chlorhexidine (PERIDEX) 0.12 % solution Use as directed 15 mLs in the mouth or throat 2 (two) times daily. 08/05/20   Fayrene Helper, PA-C  ?citalopram (CELEXA) 20 MG tablet Take 1 tablet (20 mg total) by mouth daily. 09/24/18   Antonieta Pert, MD  ?hydrOXYzine (ATARAX/VISTARIL) 25 MG tablet Take 1 tablet (25 mg total) by mouth every 6 (six) hours as needed (anxiety/agitation or CIWA < or = 10). 09/23/18   Antonieta Pert, MD  ?methocarbamol (ROBAXIN) 500 MG tablet Take 1 tablet (500 mg total) by mouth 2 (two) times daily. 03/24/20   Benjiman Core, MD  ?traZODone (DESYREL) 50 MG tablet Take 1 tablet (50 mg total) by mouth at bedtime as needed for sleep. 12/15/18   Molpus, Jonny Ruiz, MD  ?   ? ?Allergies    ?Banana, Food, and Flounder [fish allergy]   ? ?Review of Systems   ?Review of Systems  ?Constitutional:  Negative for fever.  ?Respiratory:  Negative for shortness of breath.    ?Cardiovascular:  Negative for chest pain.  ?Musculoskeletal:  Positive for arthralgias.  ? ?Physical Exam ?Updated Vital Signs ?BP 125/81 (BP Location: Left Arm)   Pulse 76   Temp 98 ?F (36.7 ?C) (Oral)   Resp 18   Ht 5\' 10"  (1.778 m)   Wt 79.4 kg   SpO2 100%   BMI 25.11 kg/m?  ?Physical Exam ?Vitals and nursing note reviewed.  ?Constitutional:   ?   Appearance: He is well-developed.  ?HENT:  ?   Head: Normocephalic and atraumatic.  ?Cardiovascular:  ?   Rate and Rhythm: Regular rhythm. Bradycardia present.  ?   Pulses:     ?     Radial pulses are 2+ on the right side.  ?   Heart sounds: Normal heart sounds.  ?Pulmonary:  ?   Effort: Pulmonary effort is normal.  ?   Breath sounds: Normal breath sounds.  ?Abdominal:  ?   General: There is no distension.  ?Musculoskeletal:  ?   Right shoulder: Tenderness present. No swelling or deformity. Decreased range of motion.  ?     Arms: ? ?   Comments: Normal strength/sensation in right hand. Normal sensation over shoulder/right chest. Pain with ROM starts around 90 degrees.  ?Skin: ?   General: Skin is warm and  dry.  ?Neurological:  ?   Mental Status: He is alert.  ? ? ?ED Results / Procedures / Treatments   ?Labs ?(all labs ordered are listed, but only abnormal results are displayed) ?Labs Reviewed - No data to display ? ?EKG ?None ? ?Radiology ?DG Shoulder Right ? ?Result Date: 04/11/2022 ?CLINICAL DATA:  26 year old male with acute shoulder pain. No known injury. EXAM: RIGHT SHOULDER - 2+ VIEW COMPARISON:  Chest radiographs 03/17/2019. FINDINGS: Bone mineralization is within normal limits. No glenohumeral joint dislocation. Intact proximal right humerus, right clavicle and scapula. Questionable mild degenerative spurring along the inferior glenoid. Negative visible right ribs and chest. IMPRESSION: No acute osseous abnormality identified about the right shoulder. Electronically Signed   By: Odessa Fleming M.D.   On: 04/11/2022 07:44   ? ?Procedures ?Procedures   ? ? ?Medications Ordered in ED ?Medications  ?ibuprofen (ADVIL) tablet 800 mg (800 mg Oral Given 04/11/22 0733)  ? ? ?ED Course/ Medical Decision Making/ A&P ?  ?                        ?Medical Decision Making ?Amount and/or Complexity of Data Reviewed ?Radiology: ordered and independent interpretation performed. ? ?Risk ?Prescription drug management. ? ? ?Patient's pain is in the area of his shoulder musculature but no bony tenderness or obvious dislocation.  Shoulder x-ray images viewed by myself and there is no dislocation or fracture.  He is neurovascularly intact.  Perhaps he slept on it wrong and has strained the muscle.  Highly doubt an acute infection such as septic joint.  At this point, will treat conservatively with NSAIDs, shoulder exercises, ice.  Follow-up with sports medicine if not improving and may need further imaging.  Otherwise appears stable for discharge home.  Very unlikely to be atypical cardiac pain. ? ? ? ? ? ? ? ?Final Clinical Impression(s) / ED Diagnoses ?Final diagnoses:  ?Strain of right shoulder, initial encounter  ? ? ?Rx / DC Orders ?ED Discharge Orders   ? ? None  ? ?  ? ? ?  ?Pricilla Loveless, MD ?04/11/22 [phone] ? ?

## 2022-04-11 NOTE — ED Triage Notes (Signed)
Pt woke up this morning with right shoulder pain.  ?

## 2024-09-30 ENCOUNTER — Other Ambulatory Visit: Payer: Self-pay

## 2024-09-30 ENCOUNTER — Emergency Department (HOSPITAL_COMMUNITY)
Admission: EM | Admit: 2024-09-30 | Discharge: 2024-09-30 | Disposition: A | Discharge status: Home / Self Care | Attending: Emergency Medicine | Admitting: Emergency Medicine

## 2024-09-30 ENCOUNTER — Emergency Department (HOSPITAL_COMMUNITY)

## 2024-09-30 DIAGNOSIS — J45909 Unspecified asthma, uncomplicated: Secondary | ICD-10-CM | POA: Insufficient documentation

## 2024-09-30 DIAGNOSIS — M25532 Pain in left wrist: Secondary | ICD-10-CM | POA: Diagnosis not present

## 2024-09-30 DIAGNOSIS — M25512 Pain in left shoulder: Secondary | ICD-10-CM | POA: Diagnosis not present

## 2024-09-30 DIAGNOSIS — E119 Type 2 diabetes mellitus without complications: Secondary | ICD-10-CM | POA: Insufficient documentation

## 2024-09-30 DIAGNOSIS — Z87891 Personal history of nicotine dependence: Secondary | ICD-10-CM | POA: Insufficient documentation

## 2024-09-30 DIAGNOSIS — Y92410 Unspecified street and highway as the place of occurrence of the external cause: Secondary | ICD-10-CM | POA: Insufficient documentation

## 2024-09-30 DIAGNOSIS — M79642 Pain in left hand: Secondary | ICD-10-CM | POA: Diagnosis present

## 2024-09-30 MED ORDER — IBUPROFEN 400 MG PO TABS
600.0000 mg | ORAL_TABLET | Freq: Once | ORAL | Status: AC
  Administered 2024-09-30: 600 mg via ORAL
  Filled 2024-09-30: qty 1
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
## 2024-09-30 NOTE — Discharge Instructions (Signed)
 Your x-rays were reassuring.  Please use the wrist brace if you find it helpful.  You may ice the area for 20 minutes at a time every few hours.  I also recommend taking 600 mg of ibuprofen  every 6 hours.  You may take Tylenol  not to exceed 1000 mg every 8 hours as well.  Follow-up as needed with hand surgery for further evaluation.  Return to the emergency department if you develop any life-threatening symptoms
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
